# Patient Record
Sex: Male | Born: 1965 | Race: Black or African American | Hispanic: No | Marital: Married | Smoking: Never smoker
Health system: Southern US, Community
[De-identification: ages and names within clinical notes are randomized; demographics above are authoritative.]

## PROBLEM LIST (undated history)

## (undated) DIAGNOSIS — M1990 Unspecified osteoarthritis, unspecified site: Secondary | ICD-10-CM

## (undated) DIAGNOSIS — J45909 Unspecified asthma, uncomplicated: Secondary | ICD-10-CM

---

## 1998-11-28 ENCOUNTER — Emergency Department (HOSPITAL_COMMUNITY): Admission: EM | Admit: 1998-11-28 | Discharge: 1998-11-28 | Payer: Self-pay | Admitting: Emergency Medicine

## 1998-11-29 ENCOUNTER — Encounter: Payer: Self-pay | Admitting: Emergency Medicine

## 1998-12-05 ENCOUNTER — Emergency Department (HOSPITAL_COMMUNITY): Admission: EM | Admit: 1998-12-05 | Discharge: 1998-12-05 | Payer: Self-pay | Admitting: Emergency Medicine

## 2011-11-02 ENCOUNTER — Emergency Department (HOSPITAL_COMMUNITY)
Admission: EM | Admit: 2011-11-02 | Discharge: 2011-11-02 | Disposition: A | Discharge status: Home / Self Care | Payer: Worker's Compensation | Attending: Emergency Medicine | Admitting: Emergency Medicine

## 2011-11-02 DIAGNOSIS — S61209A Unspecified open wound of unspecified finger without damage to nail, initial encounter: Secondary | ICD-10-CM | POA: Insufficient documentation

## 2011-11-02 DIAGNOSIS — IMO0002 Reserved for concepts with insufficient information to code with codable children: Secondary | ICD-10-CM | POA: Insufficient documentation

## 2011-11-02 HISTORY — DX: Unspecified osteoarthritis, unspecified site: M19.90

## 2011-11-02 NOTE — ED Notes (Signed)
Pt presents with puncture wound to L 2nd finger after using a hole puncher, cutting finger.  Injury occurred at 0930.  Last tetanus x 2 years ago.

## 2011-11-02 NOTE — ED Notes (Signed)
Employee called and pt. Has a laceration to a finger with uncontrolled bleeding; controlled with pressure dressing.

## 2011-11-02 NOTE — ED Provider Notes (Addendum)
History     CSN: [id number]  Arrival date & time 11/02/11  1131   First MD Initiated Contact with Patient 11/02/11 1401      Chief Complaint  Patient presents with  . Puncture Wound    (Consider location/radiation/quality/duration/timing/severity/associated sxs/prior treatment) The history is provided by the patient.   the patient is a 46 year old, right-hand dominant male.  He says that he "hole punched" his leftt index finger.  He has no other complaints.  His last tetanus shot was 3 years ago.  He has no allergies to medications.  Past Medical History  Diagnosis Date  . Arthritis     History reviewed. No pertinent past surgical history.  No family history on file.  History  Substance Use Topics  . Smoking status: Never Smoker   . Smokeless tobacco: Not on file  . Alcohol Use: No      Review of Systems  Neurological: Negative for weakness.  Hematological: Does not bruise/bleed easily.  All other systems reviewed and are negative.    Allergies  Review of patient's allergies indicates no known allergies.  Home Medications   Current Outpatient Rx  Name Route Sig Dispense Refill  . ALBUTEROL SULFATE HFA 108 (90 BASE) MCG/ACT IN AERS Inhalation Inhale 2 puffs into the lungs every 6 (six) hours as needed. For shortness of breath       BP 150/95  Pulse 89  Temp(Src) 97.9 F (36.6 C) (Oral)  Resp 16  SpO2 95%  Physical Exam  Constitutional: He is oriented to person, place, and time. He appears well-developed and well-nourished.  HENT:  Head: Normocephalic.  Eyes: Conjunctivae are normal. Pupils are equal, round, and reactive to light.  Neck: Normal range of motion.  Pulmonary/Chest: Effort normal.  Abdominal: He exhibits no distension.  Musculoskeletal:       3 millimeter, circular, loss of tissue over the distal phalanx of his left index finger with constant mild bleeding.  There is no evidence of a tendon injury or bony injury.  Neurological: He is  alert and oriented to person, place, and time.  Skin: Skin is warm and dry.  Psychiatric: He has a normal mood and affect.    ED Course  Procedures (including critical care time) Approximately 3 mm, loss of skin of the distal phalanx of the left index finger.  The parents of the wound is consistent with a punch biopsy.  We will get hemostasis and applied, dressing.  There is no tissue to suture together.  There is no evidence of an nerve or tendon injury.  There is no evidence of fracture.  His tetanus is up-to-date.   Labs Reviewed - No data to display No results found.   No diagnosis found.  2:53 PM Bleeding controlled with QUICK CLOT  MDM  Skin laceration        Nicholes Stairs, MD 11/02/11 1453  Nicholes Stairs, MD 11/02/11 1455

## 2013-02-17 ENCOUNTER — Ambulatory Visit
Admission: RE | Admit: 2013-02-17 | Discharge: 2013-02-17 | Disposition: A | Discharge status: Home / Self Care | Payer: 59 | Source: Ambulatory Visit | Attending: Allergy and Immunology | Admitting: Allergy and Immunology

## 2013-02-17 ENCOUNTER — Other Ambulatory Visit: Payer: Self-pay | Admitting: Allergy and Immunology

## 2013-02-17 DIAGNOSIS — J45901 Unspecified asthma with (acute) exacerbation: Secondary | ICD-10-CM

## 2019-12-19 ENCOUNTER — Inpatient Hospital Stay (HOSPITAL_COMMUNITY)
Admission: EM | Admit: 2019-12-19 | Discharge: 2020-01-28 | DRG: 296 | Disposition: E | Payer: 59 | Attending: Pulmonary Disease | Admitting: Pulmonary Disease

## 2019-12-19 ENCOUNTER — Inpatient Hospital Stay (HOSPITAL_COMMUNITY): Payer: 59

## 2019-12-19 ENCOUNTER — Emergency Department (HOSPITAL_COMMUNITY): Payer: 59

## 2019-12-19 DIAGNOSIS — G9340 Encephalopathy, unspecified: Secondary | ICD-10-CM | POA: Diagnosis not present

## 2019-12-19 DIAGNOSIS — J189 Pneumonia, unspecified organism: Secondary | ICD-10-CM | POA: Diagnosis present

## 2019-12-19 DIAGNOSIS — M1990 Unspecified osteoarthritis, unspecified site: Secondary | ICD-10-CM | POA: Diagnosis present

## 2019-12-19 DIAGNOSIS — G931 Anoxic brain damage, not elsewhere classified: Secondary | ICD-10-CM

## 2019-12-19 DIAGNOSIS — E876 Hypokalemia: Secondary | ICD-10-CM | POA: Diagnosis not present

## 2019-12-19 DIAGNOSIS — J440 Chronic obstructive pulmonary disease with acute lower respiratory infection: Secondary | ICD-10-CM | POA: Diagnosis present

## 2019-12-19 DIAGNOSIS — R739 Hyperglycemia, unspecified: Secondary | ICD-10-CM | POA: Diagnosis present

## 2019-12-19 DIAGNOSIS — J9622 Acute and chronic respiratory failure with hypercapnia: Secondary | ICD-10-CM | POA: Diagnosis present

## 2019-12-19 DIAGNOSIS — N179 Acute kidney failure, unspecified: Secondary | ICD-10-CM | POA: Diagnosis present

## 2019-12-19 DIAGNOSIS — Z20822 Contact with and (suspected) exposure to covid-19: Secondary | ICD-10-CM | POA: Diagnosis present

## 2019-12-19 DIAGNOSIS — J9600 Acute respiratory failure, unspecified whether with hypoxia or hypercapnia: Secondary | ICD-10-CM

## 2019-12-19 DIAGNOSIS — Z9119 Patient's noncompliance with other medical treatment and regimen: Secondary | ICD-10-CM

## 2019-12-19 DIAGNOSIS — G253 Myoclonus: Secondary | ICD-10-CM

## 2019-12-19 DIAGNOSIS — I361 Nonrheumatic tricuspid (valve) insufficiency: Secondary | ICD-10-CM | POA: Diagnosis not present

## 2019-12-19 DIAGNOSIS — J9621 Acute and chronic respiratory failure with hypoxia: Secondary | ICD-10-CM | POA: Diagnosis present

## 2019-12-19 DIAGNOSIS — J9601 Acute respiratory failure with hypoxia: Secondary | ICD-10-CM

## 2019-12-19 DIAGNOSIS — I469 Cardiac arrest, cause unspecified: Principal | ICD-10-CM | POA: Diagnosis present

## 2019-12-19 DIAGNOSIS — G936 Cerebral edema: Secondary | ICD-10-CM | POA: Diagnosis not present

## 2019-12-19 DIAGNOSIS — I10 Essential (primary) hypertension: Secondary | ICD-10-CM | POA: Diagnosis present

## 2019-12-19 DIAGNOSIS — J9602 Acute respiratory failure with hypercapnia: Secondary | ICD-10-CM

## 2019-12-19 DIAGNOSIS — Z529 Donor of unspecified organ or tissue: Secondary | ICD-10-CM

## 2019-12-19 HISTORY — DX: Unspecified asthma, uncomplicated: J45.909

## 2019-12-19 LAB — CORTISOL: Cortisol, Plasma: 19 ug/dL

## 2019-12-19 LAB — CBC
HCT: 44.7 % (ref 39.0–52.0)
Hemoglobin: 14 g/dL (ref 13.0–17.0)
MCH: 30.2 pg (ref 26.0–34.0)
MCHC: 31.3 g/dL (ref 30.0–36.0)
MCV: 96.3 fL (ref 80.0–100.0)
Platelets: 371 10*3/uL (ref 150–400)
RBC: 4.64 MIL/uL (ref 4.22–5.81)
RDW: 13.1 % (ref 11.5–15.5)
WBC: 15.4 10*3/uL — ABNORMAL HIGH (ref 4.0–10.5)
nRBC: 0 % (ref 0.0–0.2)

## 2019-12-19 LAB — TROPONIN I (HIGH SENSITIVITY)
Troponin I (High Sensitivity): 10 ng/L (ref <18)
Troponin I (High Sensitivity): 48 ng/L — ABNORMAL HIGH (ref <18)

## 2019-12-19 LAB — POCT I-STAT 7, (LYTES, BLD GAS, ICA,H+H)
Acid-base deficit: 5 mmol/L — ABNORMAL HIGH (ref 0.0–2.0)
Bicarbonate: 23.1 mmol/L (ref 20.0–28.0)
Calcium, Ion: 1.1 mmol/L — ABNORMAL LOW (ref 1.15–1.40)
HCT: 39 % (ref 39.0–52.0)
Hemoglobin: 13.3 g/dL (ref 13.0–17.0)
O2 Saturation: 100 %
Patient temperature: 96.3
Potassium: 3.6 mmol/L (ref 3.5–5.1)
Sodium: 143 mmol/L (ref 135–145)
TCO2: 25 mmol/L (ref 22–32)
pCO2 arterial: 50.9 mmHg — ABNORMAL HIGH (ref 32.0–48.0)
pH, Arterial: 7.258 — ABNORMAL LOW (ref 7.350–7.450)
pO2, Arterial: 395 mmHg — ABNORMAL HIGH (ref 83.0–108.0)

## 2019-12-19 LAB — GLUCOSE, CAPILLARY: Glucose-Capillary: 130 mg/dL — ABNORMAL HIGH (ref 70–99)

## 2019-12-19 LAB — RESPIRATORY PANEL BY RT PCR (FLU A&B, COVID)
Influenza A by PCR: NEGATIVE
Influenza B by PCR: NEGATIVE
SARS Coronavirus 2 by RT PCR: NEGATIVE

## 2019-12-19 LAB — COMPREHENSIVE METABOLIC PANEL
ALT: 24 U/L (ref 0–44)
AST: 37 U/L (ref 15–41)
Albumin: 3.4 g/dL — ABNORMAL LOW (ref 3.5–5.0)
Alkaline Phosphatase: 65 U/L (ref 38–126)
Anion gap: 15 (ref 5–15)
BUN: 16 mg/dL (ref 6–20)
CO2: 18 mmol/L — ABNORMAL LOW (ref 22–32)
Calcium: 8.2 mg/dL — ABNORMAL LOW (ref 8.9–10.3)
Chloride: 108 mmol/L (ref 98–111)
Creatinine, Ser: 1.62 mg/dL — ABNORMAL HIGH (ref 0.61–1.24)
GFR calc Af Amer: 54 mL/min — ABNORMAL LOW (ref >60)
GFR calc non Af Amer: 47 mL/min — ABNORMAL LOW (ref >60)
Glucose, Bld: 288 mg/dL — ABNORMAL HIGH (ref 70–99)
Potassium: 4.2 mmol/L (ref 3.5–5.1)
Sodium: 141 mmol/L (ref 135–145)
Total Bilirubin: 0.8 mg/dL (ref 0.3–1.2)
Total Protein: 6 g/dL — ABNORMAL LOW (ref 6.5–8.1)

## 2019-12-19 LAB — LACTIC ACID, PLASMA
Lactic Acid, Venous: 6.6 mmol/L (ref 0.5–1.9)
Lactic Acid, Venous: 4.4 mmol/L (ref 0.5–1.9)

## 2019-12-19 LAB — HEMOGLOBIN A1C
Hgb A1c MFr Bld: 6.4 % — ABNORMAL HIGH (ref 4.8–5.6)
Mean Plasma Glucose: 136.98 mg/dL

## 2019-12-19 LAB — D-DIMER, QUANTITATIVE: D-Dimer, Quant: 9.58 ug/mL-FEU — ABNORMAL HIGH (ref 0.00–0.50)

## 2019-12-19 LAB — PROCALCITONIN: Procalcitonin: <0.1 ng/mL

## 2019-12-19 LAB — PHOSPHORUS: Phosphorus: 5.2 mg/dL — ABNORMAL HIGH (ref 2.5–4.6)

## 2019-12-19 LAB — PROTIME-INR
INR: 1 (ref 0.8–1.2)
Prothrombin Time: 12.9 seconds (ref 11.4–15.2)

## 2019-12-19 LAB — MAGNESIUM: Magnesium: 2.3 mg/dL (ref 1.7–2.4)

## 2019-12-19 MED ORDER — PROPOFOL 1000 MG/100ML IV EMUL
5.0000 ug/kg/min | INTRAVENOUS | Status: DC
  Administered 2019-12-19: 40 ug/kg/min via INTRAVENOUS
  Administered 2019-12-20: 80 ug/kg/min via INTRAVENOUS
  Administered 2019-12-20: 60 ug/kg/min via INTRAVENOUS
  Administered 2019-12-20: 40 ug/kg/min via INTRAVENOUS
  Administered 2019-12-20: 60 ug/kg/min via INTRAVENOUS
  Administered 2019-12-20 – 2019-12-21 (×2): 20 ug/kg/min via INTRAVENOUS
  Filled 2019-12-19 (×6): qty 100

## 2019-12-19 MED ORDER — PROPOFOL 1000 MG/100ML IV EMUL
5.0000 ug/kg/min | INTRAVENOUS | Status: DC
  Administered 2019-12-19: 5 ug/kg/min via INTRAVENOUS

## 2019-12-19 MED ORDER — ETOMIDATE 2 MG/ML IV SOLN
INTRAVENOUS | Status: AC | PRN
  Administered 2019-12-19: 20 mg via INTRAVENOUS

## 2019-12-19 MED ORDER — METHYLPREDNISOLONE SODIUM SUCC 125 MG IJ SOLR
125.0000 mg | Freq: Once | INTRAMUSCULAR | Status: AC
  Administered 2019-12-19: 125 mg via INTRAVENOUS
  Filled 2019-12-19: qty 2

## 2019-12-19 MED ORDER — ONDANSETRON HCL 4 MG/2ML IJ SOLN: 4.0000 mg | Freq: Four times a day (QID) | INTRAMUSCULAR | Status: DC | PRN

## 2019-12-19 MED ORDER — SODIUM CHLORIDE 0.9 % IV BOLUS
1000.0000 mL | Freq: Once | INTRAVENOUS | Status: AC
  Administered 2019-12-19: 1000 mL via INTRAVENOUS

## 2019-12-19 MED ORDER — ACETAMINOPHEN 325 MG PO TABS
650.0000 mg | ORAL_TABLET | ORAL | Status: DC | PRN
  Filled 2019-12-19: qty 2

## 2019-12-19 MED ORDER — ORAL CARE MOUTH RINSE
15.0000 mL | OROMUCOSAL | Status: DC
  Administered 2019-12-20 – 2019-12-28 (×86): 15 mL via OROMUCOSAL

## 2019-12-19 MED ORDER — SODIUM CHLORIDE 0.9 % IV SOLN
500.0000 mg | Freq: Once | INTRAVENOUS | Status: AC
  Administered 2019-12-19: 500 mg via INTRAVENOUS
  Filled 2019-12-19: qty 500

## 2019-12-19 MED ORDER — INSULIN ASPART 100 UNIT/ML ~~LOC~~ SOLN
0.0000 [IU] | Freq: Three times a day (TID) | SUBCUTANEOUS | Status: DC
  Administered 2019-12-20: 2 [IU] via SUBCUTANEOUS
  Administered 2019-12-20: 12:00:00 1 [IU] via SUBCUTANEOUS

## 2019-12-19 MED ORDER — IPRATROPIUM-ALBUTEROL 0.5-2.5 (3) MG/3ML IN SOLN
3.0000 mL | Freq: Once | RESPIRATORY_TRACT | Status: AC
  Administered 2019-12-19: 3 mL via RESPIRATORY_TRACT
  Filled 2019-12-19: qty 3

## 2019-12-19 MED ORDER — CHLORHEXIDINE GLUCONATE CLOTH 2 % EX PADS: 6.0000 | MEDICATED_PAD | Freq: Every day | CUTANEOUS | Status: DC

## 2019-12-19 MED ORDER — SODIUM CHLORIDE 0.9 % IV SOLN
500.0000 mg | INTRAVENOUS | Status: AC
  Administered 2019-12-20 – 2019-12-22 (×3): 500 mg via INTRAVENOUS
  Filled 2019-12-19 (×3): qty 500

## 2019-12-19 MED ORDER — LACTATED RINGERS IV SOLN: INTRAVENOUS | Status: DC

## 2019-12-19 MED ORDER — CEFTRIAXONE SODIUM 1 G IJ SOLR: 1.0000 g | INTRAMUSCULAR | Status: DC

## 2019-12-19 MED ORDER — ROCURONIUM BROMIDE 50 MG/5ML IV SOLN
INTRAVENOUS | Status: AC | PRN
  Administered 2019-12-19: 80 mg via INTRAVENOUS

## 2019-12-19 MED ORDER — SODIUM CHLORIDE 0.9 % IV SOLN: INTRAVENOUS | Status: DC

## 2019-12-19 MED ORDER — PROPOFOL 1000 MG/100ML IV EMUL
INTRAVENOUS | Status: AC
  Filled 2019-12-19: qty 100

## 2019-12-19 MED ORDER — SODIUM CHLORIDE 0.9 % IV SOLN
2.0000 g | Freq: Once | INTRAVENOUS | Status: AC
  Administered 2019-12-19: 2 g via INTRAVENOUS
  Filled 2019-12-19: qty 20

## 2019-12-19 MED ORDER — CHLORHEXIDINE GLUCONATE 0.12% ORAL RINSE (MEDLINE KIT)
15.0000 mL | Freq: Two times a day (BID) | OROMUCOSAL | Status: DC
  Administered 2019-12-19 – 2019-12-29 (×20): 15 mL via OROMUCOSAL

## 2019-12-19 MED ORDER — PANTOPRAZOLE SODIUM 40 MG IV SOLR
40.0000 mg | Freq: Every day | INTRAVENOUS | Status: DC
  Administered 2019-12-19 – 2019-12-29 (×11): 40 mg via INTRAVENOUS
  Filled 2019-12-19 (×12): qty 40

## 2019-12-19 MED ORDER — IPRATROPIUM-ALBUTEROL 0.5-2.5 (3) MG/3ML IN SOLN
3.0000 mL | Freq: Once | RESPIRATORY_TRACT | Status: AC
  Administered 2019-12-19: 3 mL via RESPIRATORY_TRACT
  Filled 2019-12-19 (×2): qty 3

## 2019-12-19 MED ORDER — HEPARIN SODIUM (PORCINE) 5000 UNIT/ML IJ SOLN
5000.0000 [IU] | Freq: Three times a day (TID) | INTRAMUSCULAR | Status: DC
  Administered 2019-12-19 – 2019-12-28 (×27): 5000 [IU] via SUBCUTANEOUS
  Filled 2019-12-19 (×28): qty 1

## 2019-12-19 NOTE — ED Notes (Signed)
Bare hugger placed on pt , temp of 92. Via temp foley

## 2019-12-19 NOTE — ED Provider Notes (Addendum)
[facility] Emergency Department Provider Note MRN:  [id number]  Arrival date & time: 12/11/2019     Chief Complaint   post CPR   History of Present Illness   Nathaniel Casey is a 54 y.o. year-old male with unknown past medical history presenting to the ED with chief complaint of CPR.  Patient reportedly called EMS due to shortness of breath, did not answer the door for the ambulance, entry to the home was gained, patient was found on the floor pulseless, not breathing.  CPR for 12 minutes, PEA arrest, given 2 doses of epinephrine, ROSC obtained.  I was unable to obtain an accurate HPI, PMH, or ROS due to the patient's cardiac arrest, altered mental status.  Level 5 caveat.  Review of Systems  Positive for cardiac arrest, shortness of breath.  Patient's Health History    Past Medical History:  Diagnosis Date  . Arthritis     No past surgical history on file.  No family history on file.  Social History   Socioeconomic History  . Marital status: Married    Spouse name: Not on file  . Number of children: Not on file  . Years of education: Not on file  . Highest education level: Not on file  Occupational History  . Not on file  Tobacco Use  . Smoking status: Never Smoker  Substance and Sexual Activity  . Alcohol use: No  . Drug use: No  . Sexual activity: Not on file  Other Topics Concern  . Not on file  Social History Narrative  . Not on file   Social Determinants of Health   Financial Resource Strain:   . Difficulty of Paying Living Expenses: Not on file  Food Insecurity:   . Worried About Programme researcher, broadcasting/film/video in the Last Year: Not on file  . Ran Out of Food in the Last Year: Not on file  Transportation Needs:   . Lack of Transportation (Medical): Not on file  . Lack of Transportation (Non-Medical): Not on file  Physical Activity:   . Days of Exercise per Week: Not on file  . Minutes of Exercise per Session: Not on file  Stress:   .  Feeling of Stress : Not on file  Social Connections:   . Frequency of Communication with Friends and Family: Not on file  . Frequency of Social Gatherings with Friends and Family: Not on file  . Attends Religious Services: Not on file  . Active Member of Clubs or Organizations: Not on file  . Attends Banker Meetings: Not on file  . Marital Status: Not on file  Intimate Partner Violence:   . Fear of Current or Ex-Partner: Not on file  . Emotionally Abused: Not on file  . Physically Abused: Not on file  . Sexually Abused: Not on file     Physical Exam   Vitals:   12/08/2019 1730 12/23/2019 1745  BP: 113/85 107/82  Pulse: (!) 102 (!) 101  Resp: (!) 22 (!) 22  Temp:    SpO2: 100% 100%    CONSTITUTIONAL: Ill-appearing NEURO: Unresponsive, intermittently gagging on King airway EYES: Pupils unresponsive ENT/NECK:  no LAD, no JVD CARDIO: Tachycardic rate, well-perfused, normal S1 and S2 PULM: Diffuse wheezing with prolonged expiratory phase GI/GU:  normal bowel sounds, moderately distended MSK/SPINE:  No gross deformities, no edema SKIN:  no rash, atraumatic PSYCH: Unable to assess  *Additional and/or pertinent findings included in MDM below  Diagnostic and Interventional  Summary    EKG Interpretation  Date/Time:  Saturday 01/03/20 16:56:36 EST Ventricular Rate:  120 PR Interval:    QRS Duration: 109 QT Interval:  320 QTC Calculation: 453 R Axis:   -80 Text Interpretation: Sinus tachycardia LAE, consider biatrial enlargement Left anterior fascicular block Abnormal R-wave progression, early transition ST elevation, consider inferior injury Baseline wander in lead(s) V5 Confirmed by Kennis Carina [id number]) on 2020-01-03 5:08:31 PM      Cardiac Monitoring Interpretation:  Labs Reviewed  CBC - Abnormal; Notable for the following components:      Result Value   WBC 15.4 (*)    All other components within normal limits  COMPREHENSIVE METABOLIC PANEL -  Abnormal; Notable for the following components:   CO2 18 (*)    Glucose, Bld 288 (*)    Creatinine, Ser 1.62 (*)    Calcium 8.2 (*)    Total Protein 6.0 (*)    Albumin 3.4 (*)    GFR calc non Af Amer 47 (*)    GFR calc Af Amer 54 (*)    All other components within normal limits  LACTIC ACID, PLASMA - Abnormal; Notable for the following components:   Lactic Acid, Venous 6.6 (*)    All other components within normal limits  RESPIRATORY PANEL BY RT PCR (FLU A&B, COVID)  BLOOD GAS, ARTERIAL  LACTIC ACID, PLASMA  TROPONIN I (HIGH SENSITIVITY)    DG Chest Port 1 View  Final Result      Medications  ipratropium-albuterol (DUONEB) 0.5-2.5 (3) MG/3ML nebulizer solution 3 mL (has no administration in time range)  ipratropium-albuterol (DUONEB) 0.5-2.5 (3) MG/3ML nebulizer solution 3 mL (has no administration in time range)  propofol (DIPRIVAN) 1000 MG/100ML infusion (5 mcg/kg/min  80 kg Intravenous New Bag/Given 01-03-2020 1714)  propofol (DIPRIVAN) 1000 MG/100ML infusion (has no administration in time range)  cefTRIAXone (ROCEPHIN) 2 g in sodium chloride 0.9 % 100 mL IVPB (has no administration in time range)  azithromycin (ZITHROMAX) 500 mg in sodium chloride 0.9 % 250 mL IVPB (has no administration in time range)  sodium chloride 0.9 % bolus 1,000 mL (has no administration in time range)  etomidate (AMIDATE) injection (20 mg Intravenous Given Jan 03, 2020 1645)  rocuronium (ZEMURON) injection (80 mg Intravenous Given 01-03-2020 1646)  ipratropium-albuterol (DUONEB) 0.5-2.5 (3) MG/3ML nebulizer solution 3 mL (3 mLs Nebulization Given January 03, 2020 1715)  methylPREDNISolone sodium succinate (SOLU-MEDROL) 125 mg/2 mL injection 125 mg (125 mg Intravenous Given 01/03/20 1732)  sodium chloride 0.9 % bolus 1,000 mL (1,000 mLs Intravenous New Bag/Given 01/03/20 1702)     Procedures  /  Critical Care Procedure Name: Intubation Date/Time: 01-03-2020 5:08 PM Performed by: Sabas Sous, MD Pre-anesthesia  Checklist: Patient identified, Patient being monitored, Emergency Drugs available, Timeout performed and Suction available Oxygen Delivery Method: Ambu bag Preoxygenation: Pre-oxygenation with 100% oxygen Induction Type: Rapid sequence Ventilation: Mask ventilation without difficulty Laryngoscope Size: Glidescope and 4 Grade View: Grade I Tube size: 7.5 mm Number of attempts: 2 Airway Equipment and Method: Rigid stylet Placement Confirmation: ETT inserted through vocal cords under direct vision,  CO2 detector and Breath sounds checked- equal and bilateral Secured at: 23 cm Tube secured with: ETT holder Comments: First attempt with direct laryngoscopy using #2 Hyacinth Meeker, some difficulty due to large tongue, small mouth, blade too short.  Quickly transitioned to glide scope, which provided grade 1 view.  10 mg etomidate, 80 mg rocuronium used.    .Critical Care Performed by: Sabas Sous, MD  Authorized by: Maudie Flakes, MD   Critical care provider statement:    Critical care time (minutes):  38   Critical care was necessary to treat or prevent imminent or life-threatening deterioration of the following conditions:  Circulatory failure, cardiac failure and respiratory failure (Cardiac arrest, respiratory failure)   Critical care was time spent personally by me on the following activities:  Discussions with consultants, evaluation of patient's response to treatment, examination of patient, ordering and performing treatments and interventions, ordering and review of laboratory studies, ordering and review of radiographic studies, pulse oximetry, re-evaluation of patient's condition, obtaining history from patient or surrogate and review of old charts    ED Course and Medical Decision Making  I have reviewed the triage vital signs, the nursing notes, and pertinent available records from the EMR.  Pertinent labs & imaging results that were available during my care of the patient were  reviewed by me and considered in my medical decision making (see below for details).     Suspicious for COPD exacerbation causing hypoxia which led to cardiac arrest.  Patient found next to his inhaler at home, he exhibits persistently elevated end-tidal CO2's, suspect CO2 retention in the setting of COPD.  He has diffuse wheezing on exam with prolonged expiratory phase.  Received 12 minutes of CPR, unknown amount of downtime, received 2 mg epinephrine prior to arrival here.  He is hemodynamically stable, in fact he is tachycardic and hypertensive likely related to the epinephrine.  Will provide Solu-Medrol, duo nebs.  Intubated as described above.  Concern for anoxic brain injury based on his lack of neurological function prior to RSI.  Will admit to intensivist service.  6:12 PM update: Accepted for admission by intensivist service.  Patient's mother has been called and notified of patient's critical condition.  Barth Kirks. Sedonia Small, [place] [email or web address]  Final Clinical Impressions(s) / ED Diagnoses     ICD-10-CM   1. Cardiac arrest ([place])  I46.9   2. Acute respiratory failure, unspecified whether with hypoxia or hypercapnia ([place])  J96.00     ED Discharge Orders    None       Discharge Instructions Discussed with and Provided to Patient:   Discharge Instructions   None       Maudie Flakes, MD Dec 21, 2019 Evalee Jefferson    Maudie Flakes, MD Dec 21, 2019 [phone]

## 2019-12-19 NOTE — ED Triage Notes (Signed)
Patient arrived by [facility]-patient had called 911 for SOB and EMS arrival 5 minutes after call found patient unresponsive/ pulseless in living room floor. Initial PEA rate of 40. Patient had inhaler in hand. After 12 minutes of CPR and EPI X 2 positive pulses with ST. Arrived with PIV x 1, IO x 1 and King airway CBG 140 Also received NS 500

## 2019-12-19 NOTE — Progress Notes (Signed)
eLink Physician-Brief Progress Note Patient Name: Nathaniel Casey DOB: 23-Jan-1966 MRN: [id number]   Date of Service  Jan 10, 2020  HPI/Events of Note  New admit arrived in ICU after completing NCHCT in ED which showed no acute intracranial abnormality and preservation of gray-white matter differentiation.  Camera evaluation performed. 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  The patient is currently on propofol for sedation. His vitals are within normal limits. He is not requiring vasopressors. His SpO2 is 99% on FiO2 0.8.   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 No therapy is of demonstrated benefit for PHM. - Neuro consult in AM for EEG and assistance with prognostication.  Cardiac: - TTE ordered for AM. - No current need for vasopressors.  Resp: - Continue MV. Wean FiO2 as tolerated, targeting higher SpO2 goal of ~ 98% given concern for ischemic neurological insult. - Bilateral apical infiltrates on CXR (R > L) c/w aspiration pneumonitis vs pneumonia. On ceftriaxone/azithro.  ID: - ABX as above for pneumonia. - F/u tracheal aspirate culture.  Renal: - LR mIVF at 75cc/hr. - Making good UOP at present. - Repeat ABG and lactic acid in AM.  Endo: - ISS with Q4H fingerstick glucose checks.  GI: - NPO for now.  DVT PPX: Heparin [place]. GI PPX: Protonix IV daily CODE STATUS: Full Code     Intervention Category Evaluation Type: New Patient Evaluation  [name] 01-10-2020, 8:53 PM

## 2019-12-19 NOTE — H&P (Signed)
NAME:  Nathaniel Casey, MRN:  [id number], DOB:  [birth year], LOS: 0 ADMISSION DATE:  12/20/2019, CONSULTATION DATE:  REFERRING MD:  ED, CHIEF COMPLAINT:  Cardiac arrest   Brief History   Out of hospital cardiac arrest  History of present illness   We have essentially no history on this 54 year old who called EMS because he was short of breath.  He was found down with a inhaler in his hand.  He was in PEA arrest.  He underwent 8 minutes of CPR before ROSC.  Past Medical History  Unknown  Significant Hospital Events   Intubated in the department of emergency medicine on 2/20 using etomidate and rocuronium  Consults:  None  Procedures:  None  Significant Diagnostic Tests:  Head CT, D-dimer and serial troponins are pending  Micro Data:  Blood and sputum cultures were obtained in the emergency room on 2/20  Antimicrobials:  Azithromycin and Rocephin  Interim history/subjective:  Admitted from the ER on 2/20.  He is currently orally intubated and completely unresponsive  Objective   Blood pressure 107/82, pulse (!) 101, temperature (!) 96.3 F (35.7 C), temperature source Temporal, resp. rate (!) 22, height 5' 7"  (1.702 m), weight 85 kg, SpO2 100 %.    Vent Mode: PRVC FiO2 (%):  [100 %] 100 % Set Rate:  [22 bmp] 22 bmp Vt Set:  [520 mL] 520 mL PEEP:  [5 cmH20] 5 cmH20 Plateau Pressure:  [28 cmH20] 28 cmH20   Intake/Output Summary (Last 24 hours) at 12/17/2019 1836 Last data filed at 12/16/2019 1641 Gross per 24 hour  Intake 500 ml  Output --  Net 500 ml   Filed Weights   12/06/2019 1653 11/30/2019 1657  Weight: 80 kg 85 kg    Examination: General: Middle-aged male who is orally intubated and unresponsive. HENT: No external evidence of trauma. Lungs: Symmetric air movement with scattered rhonchi right greater than left and scattered wheezes.  Does not have a prolonged expiratory phase. Cardiovascular: There is significant JVD, S1 and S2 are somewhat distant and  regular without murmur rub or gallop.  There is no dependent edema the limbs are warm Abdomen: The abdomen is distended and tympanic active bowel sounds are present there are no masses Extremities: The limbs are warm without edema  neuro: The limbs remain flaccid and I suspect we are still seeing the effects of rocuronium.  He does have some slight anisocoria with the right pupil being 3 mm and the left 2 GU:   Resolved Hospital Problem list     Assessment & Plan:  This is a 54 year old with unknown past medical history who called the EMS for dyspnea and was found down and in PEA.  Total downtime is not known. We will be looking for the usual provocations for PEA arrest, in this case I am most suspicious of hypercarbia.  A D-dimer is an initial screening for PE is pending and serial enzymes are pending as well.  The initial EKG does not suggest acute ischemia. 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  A head CT is pending secondary to his anisocoria. He also has an abnormal chest x-ray with a suggestion of bilateral upper lobe infiltrates and a corresponding abnormal exam.  He has been started on inhaled bronchodilators and empirically started on a combination of azithromycin and Rocephin for community-acquired pneumonia.  He is Covid and influenza negative.  Marginally elevated creatinine with unknown baseline.  Best practice:  Diet: N.p.o. Pain/Anxiety/Delirium protocol (if indicated): Pending neurological status once rocuronium has been metabolized VAP protocol (if indicated): Ordered DVT prophylaxis: He requires SCDs and if the head CT is negative prophylactic dose heparin GI prophylaxis: Ordered Glucose control: He is hyperglycemic but I do not know whether this is representative of a chronic condition or whether he received epinephrine for  resuscitation.  Sliding scale insulin and A1c have been ordered Mobility: Bedrest Code Status: Full Family Communication: I have not yet met family Disposition:   Labs   CBC: Recent Labs  Lab 12/23/2019 1712 12/26/2019 1815  WBC 15.4*  --   HGB 14.0 13.3  HCT 44.7 39.0  MCV 96.3  --   PLT 371  --     Basic Metabolic Panel: Recent Labs  Lab 12/10/2019 1712 12/06/2019 1815  NA 141 143  K 4.2 3.6  CL 108  --   CO2 18*  --   GLUCOSE 288*  --   BUN 16  --   CREATININE 1.62*  --   CALCIUM 8.2*  --    GFR: Estimated Creatinine Clearance: 53.1 mL/min (A) (by C-G formula based on SCr of 1.62 mg/dL (H)). Recent Labs  Lab 12/09/2019 1712  WBC 15.4*  LATICACIDVEN 6.6*    Liver Function Tests: Recent Labs  Lab 12/12/2019 1712  AST 37  ALT 24  ALKPHOS 65  BILITOT 0.8  PROT 6.0*  ALBUMIN 3.4*   No results for input(s): LIPASE, AMYLASE in the last 168 hours. No results for input(s): AMMONIA in the last 168 hours.  ABG    Component Value Date/Time   PHART 7.258 (L) 12/18/2019 1815   PCO2ART 50.9 (H) 12/12/2019 1815   PO2ART 395.0 (H) 12/12/2019 1815   HCO3 23.1 12/25/2019 1815   TCO2 25 12/12/2019 1815   ACIDBASEDEF 5.0 (H) 12/14/2019 1815   O2SAT 100.0 12/06/2019 1815     Coagulation Profile: No results for input(s): INR, PROTIME in the last 168 hours.  Cardiac Enzymes: No results for input(s): CKTOTAL, CKMB, CKMBINDEX, TROPONINI in the last 168 hours.  HbA1C: No results found for: HGBA1C  CBG: No results for input(s): GLUCAP in the last 168 hours.  Review of Systems:   Unobtainable  Past Medical History  He,  has a past medical history of Arthritis.   Surgical History   No past surgical history on file.   Social History   reports that he has never smoked. He does not have any smokeless tobacco history on file. He reports that he does not drink alcohol or use drugs.   Family History   His family history is not on file.   Allergies No Known Allergies    Home Medications  Prior to Admission medications   Medication Sig Start Date End Date Taking? Authorizing Provider  albuterol (PROVENTIL HFA;VENTOLIN HFA) 108 (90 BASE) MCG/ACT inhaler Inhale 2 puffs into the lungs every 6 (six) hours as needed. For shortness of breath     [provider]     Critical care time: Greater than 32 minutes was spent in the care of this acutely ill patient with a life-threatening presentation.  Lars Masson, MD Critical Care Medicine

## 2019-12-19 NOTE — Progress Notes (Signed)
ED RT given report and aware of pt.

## 2019-12-20 ENCOUNTER — Inpatient Hospital Stay (HOSPITAL_COMMUNITY): Payer: 59

## 2019-12-20 DIAGNOSIS — G931 Anoxic brain damage, not elsewhere classified: Secondary | ICD-10-CM

## 2019-12-20 DIAGNOSIS — I361 Nonrheumatic tricuspid (valve) insufficiency: Secondary | ICD-10-CM

## 2019-12-20 LAB — RESPIRATORY PANEL BY PCR

## 2019-12-20 LAB — MAGNESIUM
Magnesium: 1.5 mg/dL — ABNORMAL LOW (ref 1.7–2.4)
Magnesium: 2.9 mg/dL — ABNORMAL HIGH (ref 1.7–2.4)

## 2019-12-20 LAB — CBC
HCT: 43.3 % (ref 39.0–52.0)
Hemoglobin: 14.4 g/dL (ref 13.0–17.0)
MCH: 29.3 pg (ref 26.0–34.0)
MCHC: 33.3 g/dL (ref 30.0–36.0)
MCV: 88 fL (ref 80.0–100.0)
Platelets: 326 10*3/uL (ref 150–400)
RBC: 4.92 MIL/uL (ref 4.22–5.81)
RDW: 13.1 % (ref 11.5–15.5)
WBC: 23.1 10*3/uL — ABNORMAL HIGH (ref 4.0–10.5)
nRBC: 0 % (ref 0.0–0.2)

## 2019-12-20 LAB — BASIC METABOLIC PANEL
Anion gap: 13 (ref 5–15)
BUN: 12 mg/dL (ref 6–20)
CO2: 21 mmol/L — ABNORMAL LOW (ref 22–32)
Calcium: 7.5 mg/dL — ABNORMAL LOW (ref 8.9–10.3)
Chloride: 106 mmol/L (ref 98–111)
Creatinine, Ser: 1.16 mg/dL (ref 0.61–1.24)
GFR calc Af Amer: >60 mL/min (ref >60)
GFR calc non Af Amer: >60 mL/min (ref >60)
Glucose, Bld: 184 mg/dL — ABNORMAL HIGH (ref 70–99)
Potassium: 3.5 mmol/L (ref 3.5–5.1)
Sodium: 140 mmol/L (ref 135–145)

## 2019-12-20 LAB — POCT I-STAT 7, (LYTES, BLD GAS, ICA,H+H)
Bicarbonate: 22.7 mmol/L (ref 20.0–28.0)
Bicarbonate: 23.2 mmol/L (ref 20.0–28.0)
Calcium, Ion: 1.03 mmol/L — ABNORMAL LOW (ref 1.15–1.40)
Calcium, Ion: 1.01 mmol/L — ABNORMAL LOW (ref 1.15–1.40)
HCT: 38 % — ABNORMAL LOW (ref 39.0–52.0)
HCT: 41 % (ref 39.0–52.0)
Hemoglobin: 12.9 g/dL — ABNORMAL LOW (ref 13.0–17.0)
Hemoglobin: 13.9 g/dL (ref 13.0–17.0)
O2 Saturation: 78 %
O2 Saturation: 100 %
Patient temperature: 99.9
Potassium: 3.3 mmol/L — ABNORMAL LOW (ref 3.5–5.1)
Potassium: 3.6 mmol/L (ref 3.5–5.1)
Sodium: 130 mmol/L — ABNORMAL LOW (ref 135–145)
Sodium: 138 mmol/L (ref 135–145)
TCO2: 24 mmol/L (ref 22–32)
TCO2: 24 mmol/L (ref 22–32)
pCO2 arterial: 31 mmHg — ABNORMAL LOW (ref 32.0–48.0)
pCO2 arterial: 33.7 mmHg (ref 32.0–48.0)
pH, Arterial: 7.473 — ABNORMAL HIGH (ref 7.350–7.450)
pH, Arterial: 7.448 (ref 7.350–7.450)
pO2, Arterial: 39 mmHg — CL (ref 83.0–108.0)
pO2, Arterial: 309 mmHg — ABNORMAL HIGH (ref 83.0–108.0)

## 2019-12-20 LAB — ECHOCARDIOGRAM COMPLETE
Height: 67 in
Weight: 2426.82 oz

## 2019-12-20 LAB — PHOSPHORUS
Phosphorus: 1.2 mg/dL — ABNORMAL LOW (ref 2.5–4.6)
Phosphorus: 3.6 mg/dL (ref 2.5–4.6)
Phosphorus: 2.6 mg/dL (ref 2.5–4.6)

## 2019-12-20 LAB — GLUCOSE, CAPILLARY
Glucose-Capillary: 136 mg/dL — ABNORMAL HIGH (ref 70–99)
Glucose-Capillary: 136 mg/dL — ABNORMAL HIGH (ref 70–99)
Glucose-Capillary: 139 mg/dL — ABNORMAL HIGH (ref 70–99)
Glucose-Capillary: 168 mg/dL — ABNORMAL HIGH (ref 70–99)
Glucose-Capillary: 175 mg/dL — ABNORMAL HIGH (ref 70–99)
Glucose-Capillary: 186 mg/dL — ABNORMAL HIGH (ref 70–99)

## 2019-12-20 LAB — BRAIN NATRIURETIC PEPTIDE: B Natriuretic Peptide: 68.1 pg/mL (ref 0.0–100.0)

## 2019-12-20 LAB — MRSA PCR SCREENING: MRSA by PCR: NEGATIVE

## 2019-12-20 LAB — LACTIC ACID, PLASMA: Lactic Acid, Venous: 3.9 mmol/L (ref 0.5–1.9)

## 2019-12-20 MED ORDER — VITAL HIGH PROTEIN PO LIQD
1000.0000 mL | ORAL | Status: DC
  Administered 2019-12-20: 1000 mL

## 2019-12-20 MED ORDER — BISACODYL 10 MG RE SUPP: 10.0000 mg | Freq: Every day | RECTAL | Status: DC | PRN

## 2019-12-20 MED ORDER — LACTATED RINGERS IV SOLN: INTRAVENOUS | Status: DC

## 2019-12-20 MED ORDER — SODIUM CHLORIDE 0.9 % IV SOLN: 2.0000 g | INTRAVENOUS | Status: DC

## 2019-12-20 MED ORDER — METHYLPREDNISOLONE SODIUM SUCC 125 MG IJ SOLR
60.0000 mg | Freq: Two times a day (BID) | INTRAMUSCULAR | Status: AC
  Administered 2019-12-20 – 2019-12-21 (×4): 60 mg via INTRAVENOUS
  Filled 2019-12-20 (×4): qty 2

## 2019-12-20 MED ORDER — CALCIUM GLUCONATE-NACL 1-0.675 GM/50ML-% IV SOLN
1.0000 g | Freq: Once | INTRAVENOUS | Status: AC
  Administered 2019-12-20: 1000 mg via INTRAVENOUS
  Filled 2019-12-20: qty 50

## 2019-12-20 MED ORDER — FENTANYL CITRATE (PF) 100 MCG/2ML IJ SOLN
50.0000 ug | INTRAMUSCULAR | Status: DC | PRN
  Administered 2019-12-22: 50 ug via INTRAVENOUS
  Filled 2019-12-20: qty 2

## 2019-12-20 MED ORDER — POTASSIUM PHOSPHATES 15 MMOLE/5ML IV SOLN
30.0000 mmol | Freq: Once | INTRAVENOUS | Status: AC
  Administered 2019-12-20: 30 mmol via INTRAVENOUS
  Filled 2019-12-20: qty 10

## 2019-12-20 MED ORDER — ACETAMINOPHEN 160 MG/5ML PO SOLN
650.0000 mg | ORAL | Status: DC | PRN
  Administered 2019-12-23 – 2019-12-27 (×4): 650 mg
  Filled 2019-12-20 (×4): qty 20.3

## 2019-12-20 MED ORDER — CHLORHEXIDINE GLUCONATE CLOTH 2 % EX PADS
6.0000 | MEDICATED_PAD | Freq: Every day | CUTANEOUS | Status: DC
  Administered 2019-12-21 – 2019-12-29 (×9): 6 via TOPICAL

## 2019-12-20 MED ORDER — METHYLPREDNISOLONE SODIUM SUCC 125 MG IJ SOLR
60.0000 mg | Freq: Every day | INTRAMUSCULAR | Status: AC
  Administered 2019-12-22 – 2019-12-24 (×3): 60 mg via INTRAVENOUS
  Filled 2019-12-20 (×3): qty 2

## 2019-12-20 MED ORDER — SODIUM CHLORIDE 0.9 % IV SOLN
6.0000 g | Freq: Once | INTRAVENOUS | Status: AC
  Administered 2019-12-20: 6 g via INTRAVENOUS
  Filled 2019-12-20: qty 2

## 2019-12-20 MED ORDER — ACETAMINOPHEN 160 MG/5ML PO SOLN
650.0000 mg | Freq: Once | ORAL | Status: AC
  Administered 2019-12-20: 650 mg
  Filled 2019-12-20: qty 20.3

## 2019-12-20 MED ORDER — INSULIN ASPART 100 UNIT/ML ~~LOC~~ SOLN
0.0000 [IU] | SUBCUTANEOUS | Status: DC
  Administered 2019-12-20 (×2): 1 [IU] via SUBCUTANEOUS
  Administered 2019-12-21: 2 [IU] via SUBCUTANEOUS
  Administered 2019-12-21: 1 [IU] via SUBCUTANEOUS
  Administered 2019-12-21: 20:00:00 2 [IU] via SUBCUTANEOUS
  Administered 2019-12-21 – 2019-12-23 (×8): 1 [IU] via SUBCUTANEOUS
  Administered 2019-12-23: 2 [IU] via SUBCUTANEOUS
  Administered 2019-12-23 (×2): 1 [IU] via SUBCUTANEOUS
  Administered 2019-12-23 – 2019-12-24 (×2): 2 [IU] via SUBCUTANEOUS
  Administered 2019-12-24 – 2019-12-25 (×5): 1 [IU] via SUBCUTANEOUS
  Administered 2019-12-25: 2 [IU] via SUBCUTANEOUS
  Administered 2019-12-25 – 2019-12-26 (×7): 1 [IU] via SUBCUTANEOUS
  Administered 2019-12-27: 2 [IU] via SUBCUTANEOUS
  Administered 2019-12-27: 1 [IU] via SUBCUTANEOUS
  Administered 2019-12-27: 2 [IU] via SUBCUTANEOUS
  Administered 2019-12-27 – 2019-12-28 (×8): 1 [IU] via SUBCUTANEOUS

## 2019-12-20 MED ORDER — MIDAZOLAM HCL 2 MG/2ML IJ SOLN
2.0000 mg | INTRAMUSCULAR | Status: DC | PRN
  Administered 2019-12-23: 10:00:00 2 mg via INTRAVENOUS
  Filled 2019-12-20: qty 2

## 2019-12-20 MED ORDER — PRO-STAT SUGAR FREE PO LIQD
30.0000 mL | Freq: Two times a day (BID) | ORAL | Status: DC
  Administered 2019-12-20 – 2019-12-21 (×3): 30 mL
  Filled 2019-12-20 (×3): qty 30

## 2019-12-20 MED ORDER — FENTANYL CITRATE (PF) 100 MCG/2ML IJ SOLN
50.0000 ug | INTRAMUSCULAR | Status: DC | PRN
  Administered 2019-12-22: 50 ug via INTRAVENOUS
  Administered 2019-12-23 – 2019-12-24 (×4): 100 ug via INTRAVENOUS
  Administered 2019-12-24: 50 ug via INTRAVENOUS
  Filled 2019-12-20 (×8): qty 2

## 2019-12-20 MED ORDER — DOCUSATE SODIUM 50 MG/5ML PO LIQD
100.0000 mg | Freq: Two times a day (BID) | ORAL | Status: DC | PRN
  Administered 2019-12-27: 100 mg
  Filled 2019-12-20: qty 10

## 2019-12-20 MED ORDER — MIDAZOLAM HCL 2 MG/2ML IJ SOLN
2.0000 mg | INTRAMUSCULAR | Status: DC | PRN
  Administered 2019-12-23 – 2019-12-24 (×8): 2 mg via INTRAVENOUS
  Filled 2019-12-20 (×8): qty 2

## 2019-12-20 MED ORDER — POTASSIUM CHLORIDE 20 MEQ/15ML (10%) PO SOLN
40.0000 meq | Freq: Once | ORAL | Status: AC
  Administered 2019-12-20: 40 meq via ORAL
  Filled 2019-12-20: qty 30

## 2019-12-20 NOTE — Progress Notes (Signed)
eLink Physician-Brief Progress Note Patient Name: Nathaniel Casey DOB: 1966/09/20 MRN: [id number]   Date of Service  12/20/2019  HPI/Events of Note  Low K and phos.  eICU Interventions  Ordered 30 mmol of KPhos.     Intervention Category Intermediate Interventions: Electrolyte abnormality - evaluation and management  Marveen Reeks Jacqueline Spofford 12/20/2019, 5:42 AM

## 2019-12-20 NOTE — Progress Notes (Signed)
NAME:  Nathaniel Casey, MRN:  [id number], DOB:  11-14-1965, LOS: 1 ADMISSION DATE:  12/25/2019, CONSULTATION DATE:  REFERRING MD:  ED, CHIEF COMPLAINT:  Cardiac arrest   Brief History   Out of hospital cardiac arrest  History of present illness   We have essentially no history on this 54 year old who called EMS because he was short of breath.  He was found down with a inhaler in his hand.  He was in PEA arrest.  He underwent 8 minutes of CPR before ROSC.  Past Medical History  Unknown  Significant Hospital Events   2/20 Intubated in the department of emergency medicine on 2/20 using etomidate and rocuronium. CT H without acute abnormality. Myoclonic jerking noted at bedside, started on propofol  2/21 ongoing rhythmic jerking, increasing when propofol paused. Propofol resumed.   Consults:  Neurology   Procedures:  2/20 ETT> Significant Diagnostic Tests:  Head CT 2/20> no acute intracranial abnormality   Micro Data:  Blood and sputum cultures were obtained in the emergency room on 2/20  Antimicrobials:  Azithromycin and Rocephin  Interim history/subjective:  FiO2 has been weaned Overnight, increasing myoclonic seeming movement. Propofol increased.  Paused for exam 2/21 with notable increase in involuntary jerking movement, subsequently resumed.   Objective   Blood pressure 115/81, pulse 96, temperature 98.8 F (37.1 C), resp. rate (!) 22, height 5\' 7"  (1.702 m), weight 68.8 kg, SpO2 100 %.    Vent Mode: PRVC FiO2 (%):  [40 %-100 %] 40 % Set Rate:  [22 bmp] 22 bmp Vt Set:  [520 mL] 520 mL PEEP:  [5 cmH20] 5 cmH20 Plateau Pressure:  [15 cmH20-28 cmH20] 17 cmH20   Intake/Output Summary (Last 24 hours) at 12/20/2019 0905 Last data filed at 12/20/2019 0827 Gross per 24 hour  Intake 4539.95 ml  Output 2175 ml  Net 2364.95 ml   Filed Weights   11/30/2019 1657 12/26/2019 2000 12/20/19 0420  Weight: 85 kg 68.8 kg 68.8 kg    Examination: General: WDWN middle aged M,  intubated sedated, NAD but with intermittent involuntary BUE BLE movement  HENT: NCAT Pink mmm anicteric sclera. ETT secure trachea midline  Lungs: Symmetrical chest expansion. Bilateral rhonchi. Faint wheeze. No accessory muscle use.  Cardiovascular: Tachycardic rate. s1s2 are audible but distant. No appreciable rgm.   Abdomen: round, mildly distended. normaoctive x4 Extremities: Symmetrical muscle bulk, no obvious joint deformity, no cyanosis.  Neuro: Involuntary BUE BLE jerking movements. Slight anisocoria; R pupil 24mm L pupil 24mm. Does not awaken or follow commands. Increasing involuntary movement with stimulation.  GU: foley  Resolved Hospital Problem list     Assessment & Plan:  This is a 54 year old with unknown past medical history who called the EMS for dyspnea and was found down and in PEA.  Total downtime is not known. Unfortunately PMH is largely unknown at this time, with only known home med being albuterol.  S/p PEA arrest with ROSC -unknown total down time, received 12 minutes CPR, 2 amps Epi with ROSC -suspect provoked by hypercarbic/hypoxic respiratory failure as EMS was dispatched for SOB, patient found pulseless with inhaler, and presenting EtCO2 elevated.  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 Doubt these  are largely contributing to this involuntary movement but is possible to contribute P -will consult neurology for LTM vs EEG  -Suspect we will need MRI pending eeg  -continue propofol -Replacing calcium and mag   Acute on chronic respiratory failure with hypercarbia, hypoxia  requiring intubation -home med albuterol-- hx is not known but suspect COPD vs asthma  -Suspected CAP with BUL infiltrates on CXR, improving. 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 Will discuss with PCCM MD -Follow up ECHO   AKI -unknown baseline Cr P -Strict I/O -trend renal indices  Leukocytosis -possible CAP, possibly reactive in setting of cardiac arrest, steroids -PCT < 0.1 -increasing temp overnight although this seemed to correlate to increasing myoclonic-seeming movement  P -empiric abx for possible CAP as above -follow up culture data -trend cbc, fever curve -APAP for fever   Hypocalcemia Hypomagnesemia Hypophosphatemia P -Replacing, trend and continue to replace PRN   Hyperglycemia P -SSI   Best practice:  Diet: N.p.o. Pain/Anxiety/Delirium protocol (if indicated): Propofol VAP protocol (if indicated): Ordered DVT prophylaxis: SCD SQH GI prophylaxis: Protonix Glucose control: SSI Mobility: Bedrest Code Status: Full Family Communication: Pending 2/21 Disposition: ICU   Labs   CBC: Recent Labs  Lab Jan 10, 2020 1712 01/10/2020 1815 12/20/19 0410 12/20/19 0439 12/20/19 0625  WBC 15.4*  --   --  23.1*  --   HGB 14.0 13.3 12.9* 14.4 13.9  HCT 44.7 39.0 38.0* 43.3 41.0  MCV 96.3  --   --  88.0  --   PLT 371  --   --  326  --     Basic Metabolic Panel: Recent Labs  Lab 01/10/20 1712 01-10-2020 1815 10-Jan-2020 1907 12/20/19 0148 12/20/19 0410 12/20/19 0625   NA 141 143  --  140 130* 138  K 4.2 3.6  --  3.5 3.3* 3.6  CL 108  --   --  106  --   --   CO2 18*  --   --  21*  --   --   GLUCOSE 288*  --   --  184*  --   --   BUN 16  --   --  12  --   --   CREATININE 1.62*  --   --  1.16  --   --   CALCIUM 8.2*  --   --  7.5*  --   --   MG  --   --  2.3 1.5*  --   --   PHOS  --   --  5.2* 1.2*  --   --    GFR: Estimated Creatinine Clearance: 66.5 mL/min (by C-G formula based on SCr of 1.16 mg/dL). Recent Labs  Lab 2020/01/10 1712 January 10, 2020 1907 10-Jan-2020 1948 12/20/19 0148 12/20/19 0439  PROCALCITON  --  <0.10  --   --   --   WBC 15.4*  --   --   --  23.1*  LATICACIDVEN 6.6*  --  4.4* 3.9*  --  Liver Function Tests: Recent Labs  Lab 2019-12-21 1712  AST 37  ALT 24  ALKPHOS 65  BILITOT 0.8  PROT 6.0*  ALBUMIN 3.4*   No results for input(s): LIPASE, AMYLASE in the last 168 hours. No results for input(s): AMMONIA in the last 168 hours.  ABG    Component Value Date/Time   PHART 7.448 12/20/2019 0625   PCO2ART 33.7 12/20/2019 0625   PO2ART 309.0 (H) 12/20/2019 0625   HCO3 23.2 12/20/2019 0625   TCO2 24 12/20/2019 0625   ACIDBASEDEF 5.0 (H) 21-Dec-2019 1815   O2SAT 100.0 12/20/2019 0625     Coagulation Profile: Recent Labs  Lab 12-21-19 1948  INR 1.0    Cardiac Enzymes: No results for input(s): CKTOTAL, CKMB, CKMBINDEX, TROPONINI in the last 168 hours.  HbA1C: Hgb A1c MFr Bld  Date/Time Value Ref Range Status  December 21, 2019 07:48 PM 6.4 (H) 4.8 - 5.6 % Final    Comment:    (NOTE) Pre diabetes:          5.7%-6.4% Diabetes:              >6.4% Glycemic control for   <7.0% adults with diabetes     CBG: Recent Labs  Lab Dec 21, 2019 2031 12/20/19 0004 12/20/19 0409 12/20/19 0734  GLUCAP 130* 168* 175* 186*   CRITICAL CARE Performed by: Lanier Clam   Total critical care time: 50 minutes  Critical care time was exclusive of separately billable procedures and treating other patients.  Critical care was  necessary to treat or prevent imminent or life-threatening deterioration.  Critical care was time spent personally by me on the following activities: development of treatment plan with patient and/or surrogate as well as nursing, discussions with consultants, evaluation of patient's response to treatment, examination of patient, obtaining history from patient or surrogate, ordering and performing treatments and interventions, ordering and review of laboratory studies, ordering and review of radiographic studies, pulse oximetry and re-evaluation of patient's condition.  Tessie Fass MSN, AGACNP-BC [facility] [phone] If no answer, [phone] 12/20/2019, 9:05 AM

## 2019-12-20 NOTE — Progress Notes (Signed)
eLink Physician-Brief Progress Note Patient Name: Nathaniel Casey DOB: 1966-01-23 MRN: [id number]   Date of Service  12/20/2019  HPI/Events of Note  T 100.2, HR 133 (ST), BP 143/102.  Patient also with increasing post-hypoxic myoclonus.   eICU Interventions  Ordered acetaminophen PO, cooling blanket, and neurology consult for myoclonus and post-arrest care.     Intervention Category Intermediate Interventions: Other:  [name] 12/20/2019, 4:06 AM

## 2019-12-20 NOTE — Progress Notes (Signed)
  Echocardiogram 2D Echocardiogram has been performed.  Nathaniel Casey F 12/20/2019, 11:11 AM

## 2019-12-20 NOTE — Progress Notes (Signed)
[facility] ADULT ICU REPLACEMENT PROTOCOL FOR AM LAB REPLACEMENT ONLY  The patient does apply for the [facility] Adult ICU Electrolyte Replacment Protocol based on the criteria listed below:   1. Is GFR >/= 40 ml/min? Yes.    Patient's GFR today is >60 2. Is urine output >/= 0.5 ml/kg/hr for the last 6 hours? Yes.   Patient's UOP is 1.4 ml/kg/hr 3. Is BUN < 60 mg/dL? Yes.    Patient's BUN today is 12 4. Abnormal electrolyte(s): Mag-1.5 5. Ordered repletion with: per protocol 6. If a panic level lab has been reported, has the CCM MD in charge been notified? Yes.  .   Physician:  Dr. Edmon Crape, [place] Boos 12/20/2019 5:44 AM

## 2019-12-20 NOTE — Progress Notes (Signed)
PHARMACY NOTE:  ANTIMICROBIAL DOSAGE ADJUSTMENT  Current antimicrobial regimen includes a mismatch between antimicrobial dosage.  As per policy approved by the Pharmacy & Therapeutics and Medical Executive Committees, the antimicrobial dosage will be adjusted accordingly.  Current antimicrobial dosage:  Ceftriaxone 1g IM q24h  Indication: CAP   Antimicrobial dosage has been changed to:  Ceftriaxone 2g IV q24h  Additional comments: Change to IV route, increase dose in ICU patient   Babs Bertin, PharmD, BCPS Please check AMION for all [facility] contact numbers Clinical Pharmacist 12/20/2019 7:51 AM

## 2019-12-20 NOTE — Progress Notes (Signed)
PCCM Family Communication Note  After multiple attempts, able to reach patient's emergency contact - patient's mother. Provided updates RE clinical course and ongoing concerns. All questions answered. Patient is notably very appreciative for conversation with Dr. Amada Jupiter earlier today and expresses thanks for neurology's help in caring for this patient.    Tessie Fass MSN, AGACNP-BC  Pulmonary/Critical Care Medicine [phone] If no answer, [phone] 12/20/2019, 3:59 PM

## 2019-12-20 NOTE — Consult Note (Signed)
Neurology Consultation Reason for Consult: Concern for anoxic brain injury Referring Physician: Paralee Cancel  CC: Unresponsiveness  History is obtained from: Chart  HPI: Nathaniel Casey is a 54 y.o. male with unclear past medical history he lives alone and activated 911 last night due to respiratory difficulties.  He was found apparently try to get to his inhaler, but suffered a respiratory arrest.  He was down for approximately 20 minutes.  Last night, there was report of myoclonus which was treated with propofol with cessation.  No myoclonus has been seen today.   ROS:  Unable to obtain due to altered mental status.   Past Medical History:  Diagnosis Date  . Arthritis      Family history: Unable to obtain due to altered mental status.   Social History:  reports that he has never smoked. He does not have any smokeless tobacco history on file. He reports that he does not drink alcohol or use drugs.   Exam: Current vital signs: BP 96/68   Pulse 78   Temp 97.9 F (36.6 C)   Resp (!) 22   Ht 5\' 7"  (1.702 m)   Wt 68.8 kg   SpO2 99%   BMI 23.76 kg/m  Vital signs in last 24 hours: Temp:  [92.3 F (33.5 C)-100.2 F (37.9 C)] 97.9 F (36.6 C) (02/21 1230) Pulse Rate:  [55-133] 78 (02/21 1230) Resp:  [9-31] 22 (02/21 1230) BP: (91-192)/(62-135) 96/68 (02/21 1230) SpO2:  [98 %-100 %] 99 % (02/21 1230) FiO2 (%):  [40 %-100 %] 40 % (02/21 1159) Weight:  [68.8 kg-85 kg] 68.8 kg (02/21 0420)   Physical Exam  Constitutional: Appears well-developed and well-nourished.  Psych: Unresponsive Eyes: No scleral injection HENT: ET tube in place MSK: no joint deformities.  Cardiovascular: Normal rate and regular rhythm.  Respiratory: Ventilated GI: Soft.  No distension. There is no tenderness.  Skin: WDI  Neuro: Mental Status: Does not open eyes or follow commands Cranial Nerves: II:  Pupils are equal, round, and reactive to light.   III,IV, VI: eyes are conjugate V:VII:  corneal is absent on the left, weak on the right.  X: cough present Motor: Extensor posturing bilaterally tactile stimulation in the arms and legs Sensory: As above Cerebellar: Does not perform   I have reviewed labs in epic and the results pertinent to this consultation are: Hypocalcemia at 7.5 with albumin of 3.4.  I have reviewed the images obtained: Initial CT head was negative  EEG with burst suppression, though unclear in the setting of high doses of propofol  Impression: 54 year old male with likely diffuse anoxic injury.  I am not sure if the movements seen overnight were actually myoclonus versus extensor posturing as I did not witness them, but certainly his current exam and EEG are concerning for poor prognosis.  I do not think we can be definitive yet, given his high dose of propofol, but I would favor weaning this while on EEG monitoring.  I discussed my concerns with his mother who expressed understanding.  Recommendations: 1) overnight EEG 2) wean propofol as tolerated 3) repeat head CT in the morning   This patient is critically ill and at significant risk of neurological worsening, death and care requires constant monitoring of vital signs, hemodynamics,respiratory and cardiac monitoring, neurological assessment, discussion with family, other specialists and medical decision making of high complexity. I spent 35 minutes of neurocritical care time  in the care of  this patient. This was time spent independent  of any time provided by nurse practitioner or PA.  Roland Rack, MD [facility] [phone]  If 7pm- 7am, please page neurology on call as listed in [place]. 12/20/2019  1:21 PM

## 2019-12-20 NOTE — Progress Notes (Signed)
LTM EEG hooked up and running - no initial skin breakdown - push button tested - neuro notified.  

## 2019-12-21 LAB — GLUCOSE, CAPILLARY
Glucose-Capillary: 116 mg/dL — ABNORMAL HIGH (ref 70–99)
Glucose-Capillary: 122 mg/dL — ABNORMAL HIGH (ref 70–99)
Glucose-Capillary: 124 mg/dL — ABNORMAL HIGH (ref 70–99)
Glucose-Capillary: 138 mg/dL — ABNORMAL HIGH (ref 70–99)
Glucose-Capillary: 139 mg/dL — ABNORMAL HIGH (ref 70–99)
Glucose-Capillary: 162 mg/dL — ABNORMAL HIGH (ref 70–99)
Glucose-Capillary: 165 mg/dL — ABNORMAL HIGH (ref 70–99)

## 2019-12-21 LAB — BASIC METABOLIC PANEL
Anion gap: 10 (ref 5–15)
BUN: 18 mg/dL (ref 6–20)
CO2: 21 mmol/L — ABNORMAL LOW (ref 22–32)
Calcium: 8.2 mg/dL — ABNORMAL LOW (ref 8.9–10.3)
Chloride: 108 mmol/L (ref 98–111)
Creatinine, Ser: 1.07 mg/dL (ref 0.61–1.24)
GFR calc Af Amer: >60 mL/min (ref >60)
GFR calc non Af Amer: >60 mL/min (ref >60)
Glucose, Bld: 152 mg/dL — ABNORMAL HIGH (ref 70–99)
Potassium: 4.6 mmol/L (ref 3.5–5.1)
Sodium: 139 mmol/L (ref 135–145)

## 2019-12-21 LAB — CBC
HCT: 41.5 % (ref 39.0–52.0)
Hemoglobin: 13.7 g/dL (ref 13.0–17.0)
MCH: 30.1 pg (ref 26.0–34.0)
MCHC: 33 g/dL (ref 30.0–36.0)
MCV: 91.2 fL (ref 80.0–100.0)
Platelets: 290 10*3/uL (ref 150–400)
RBC: 4.55 MIL/uL (ref 4.22–5.81)
RDW: 13.9 % (ref 11.5–15.5)
WBC: 21.4 10*3/uL — ABNORMAL HIGH (ref 4.0–10.5)
nRBC: 0 % (ref 0.0–0.2)

## 2019-12-21 LAB — PHOSPHORUS
Phosphorus: 3 mg/dL (ref 2.5–4.6)
Phosphorus: 3.1 mg/dL (ref 2.5–4.6)

## 2019-12-21 LAB — MAGNESIUM: Magnesium: 2.5 mg/dL — ABNORMAL HIGH (ref 1.7–2.4)

## 2019-12-21 MED ORDER — VITAL AF 1.2 CAL PO LIQD
1000.0000 mL | ORAL | Status: DC
  Administered 2019-12-21 – 2019-12-27 (×8): 1000 mL

## 2019-12-21 MED ORDER — AMLODIPINE BESYLATE 5 MG PO TABS
5.0000 mg | ORAL_TABLET | Freq: Every day | ORAL | Status: DC
  Administered 2019-12-21 – 2019-12-28 (×8): 5 mg via ORAL
  Filled 2019-12-21 (×8): qty 1

## 2019-12-21 MED ORDER — SCOPOLAMINE 1 MG/3DAYS TD PT72
1.0000 | MEDICATED_PATCH | TRANSDERMAL | Status: DC
  Administered 2019-12-21 – 2019-12-27 (×3): 1.5 mg via TRANSDERMAL
  Filled 2019-12-21 (×3): qty 1

## 2019-12-21 NOTE — Progress Notes (Signed)
NAME:  Nathaniel Casey, MRN:  [id number], DOB:  23-Jan-1966, LOS: 2 ADMISSION DATE:  01-14-2020, CONSULTATION DATE:  REFERRING MD:  ED, CHIEF COMPLAINT:  Cardiac arrest   Brief History   Out of hospital cardiac arrest  History of present illness   We have essentially no history on this 54 year old who called EMS because he was short of breath.  He was found down with a inhaler in his hand.  He was in PEA arrest.  He underwent 8 minutes of CPR before ROSC.  Past Medical History  Unknown  Significant Hospital Events   2/20 Intubated in the department of emergency medicine on 2/20 using etomidate and rocuronium. CT H without acute abnormality. Myoclonic jerking noted at bedside, started on propofol  2/21 ongoing rhythmic jerking, increasing when propofol paused. Propofol resumed.  2/22 propofol off, followed some commands for neurology  Consults:  Neurology   Procedures:  2/20 ETT> Significant Diagnostic Tests:  Head CT 2/20> no acute intracranial abnormality   Micro Data:  Blood and sputum cultures were obtained in the emergency room on 2/20  Antimicrobials:  Azithromycin and Rocephin  Interim history/subjective:  No events, does not respond to commands for me, does trigger vent.  Objective   Blood pressure (!) 164/101, pulse (!) 110, temperature 99 F (37.2 C), resp. rate (!) 22, height 5\' 7"  (1.702 m), weight 68.5 kg, SpO2 99 %.    Vent Mode: PRVC FiO2 (%):  [40 %] 40 % Set Rate:  [22 bmp] 22 bmp Vt Set:  [520 mL] 520 mL PEEP:  [5 cmH20] 5 cmH20 Plateau Pressure:  [16 cmH20-26 cmH20] 16 cmH20   Intake/Output Summary (Last 24 hours) at 12/21/2019 1051 Last data filed at 12/21/2019 0930 Gross per 24 hour  Intake 3523.53 ml  Output 1250 ml  Net 2273.53 ml   Filed Weights   01/14/20 2000 12/20/19 0420 12/21/19 0432  Weight: 68.8 kg 68.8 kg 68.5 kg    Examination: GEN: ill appearing man on vent HEENT: ETT in place, copious oral secretions CV: RRR, ext  warm PULM: Scattered rhonci, no accessory muscle sue GI: Soft, +BS EXT: No edema NEURO: gags on tube to stimulation, I could not get him to follow commands, occasionally looks to do decerebrate posturing PSYCH: cannot assess SKIN: no rashes   Resolved Hospital Problem list     Assessment & Plan:  This is a 54 year old with unknown past medical history who called the EMS for dyspnea and was found down and in PEA.  Total downtime is not known. Unfortunately PMH is largely unknown at this time, with only known home med being albuterol.  # Cardiac arrest, PEA thought resp in origin.  Echo benign. - LTVEEG, appreciate neuro help - MRI likely tomorrow - supportive care x 72 hours then can comment on prognosticating  # Probable COPD in flare - Nebs, steroids as ordered  # Various electrolyte abnormalities, AKI- resolved, continue LR for another day     Best practice:  Diet: N.p.o. Pain/Anxiety/Delirium protocol (if indicated): Propofol VAP protocol (if indicated): Ordered DVT prophylaxis: SCD SQH GI prophylaxis: Protonix Glucose control: SSI Mobility: Bedrest Code Status: Full Family Communication: Updated 2/21 by 3/21 Disposition: ICU   The patient is critically ill with multiple organ systems failure and requires high complexity decision making for assessment and support, frequent evaluation and titration of therapies, application of advanced monitoring technologies and extensive interpretation of multiple databases. Critical Care Time devoted to patient care services described in this note  independent of APP/resident time (if applicable)  is 31 minutes.   Erskine Emery MD [facility] 12/21/2019 10:57 AM Personal pager: [phone] If unanswered, please page CCM On-call: [phone]

## 2019-12-21 NOTE — Progress Notes (Signed)
Initial Nutrition Assessment  DOCUMENTATION CODES:   Not applicable  INTERVENTION:   To better meet estimated nutrition needs, change TF:   Vital AF 1.2 at 65 ml/h (1560 ml per day)   D/C Pro-stat   Provides 1872 kcal, 117 gm protein, 1265 ml free water daily  NUTRITION DIAGNOSIS:   Inadequate oral intake related to inability to eat as evidenced by NPO status.  GOAL:   Patient will meet greater than or equal to 90% of their needs  MONITOR:   Vent status, TF tolerance, Labs  REASON FOR ASSESSMENT:   Ventilator, Consult Enteral/tube feeding initiation and management  ASSESSMENT:   54 yo male admitted S/P PEA arrest. PMH includes arthritis.   Discussed patient in ICU rounds and with RN today. Continuous EEG ongoing. Plans for MRI tomorrow. Sedation off this morning. Received MD Consult for TF initiation and management. Adult TF protocol ordered. Currently receiving Vital High Protein at 40 ml/h with Pro-stat 30 ml BID to provide 1160 kcal, 114 gm protein, 803 ml free water daily.   Patient is currently intubated on ventilator support MV: 10.9 L/min Temp (24hrs), Avg:98.7 F (37.1 C), Min:97.3 F (36.3 C), Max:99.9 F (37.7 C)  Propofol: none  Labs reviewed. Mag 2.5  CBG's: 116-139-122-165  Medications reviewed and include Novolog, Solu-medrol. Propofol off since this morning.    NUTRITION - FOCUSED PHYSICAL EXAM:    Most Recent Value  Orbital Region  No depletion  Upper Arm Region  No depletion  Thoracic and Lumbar Region  No depletion  Buccal Region  Unable to assess  Temple Region  No depletion  Clavicle Bone Region  No depletion  Clavicle and Acromion Bone Region  No depletion  Scapular Bone Region  No depletion  Dorsal Hand  No depletion  Patellar Region  No depletion  Anterior Thigh Region  No depletion  Posterior Calf Region  No depletion  Edema (RD Assessment)  None  Hair  Reviewed  Eyes  Unable to assess  Mouth  Unable to assess   Skin  Reviewed  Nails  Reviewed       Diet Order:   Diet Order            Diet NPO time specified  Diet effective now              EDUCATION NEEDS:   Not appropriate for education at this time  Skin:  Skin Assessment: Reviewed RN Assessment  Last BM:  no BM documented  Height:   Ht Readings from Last 1 Encounters:  12/31/2019 5\' 7"  (1.702 m)    Weight:   Wt Readings from Last 1 Encounters:  12/21/19 68.5 kg    Ideal Body Weight:  67.3 kg  BMI:  Body mass index is 23.65 kg/m.  Estimated Nutritional Needs:   Kcal:  1840  Protein:  100-115 gm  Fluid:  >/= 1.9 L    12/23/19, RD, LDN, CNSC Please refer to Amion for contact information.

## 2019-12-21 NOTE — Procedures (Addendum)
Patient Name: ERI PLATTEN  MRN: [id number]  Epilepsy Attending: Charlsie Quest  Referring Physician/Provider: Dr Ritta Slot Duration: 12/20/2019 1208 to 12/21/2019 1208  Patient history: 54 year old male with likely diffuse anoxic injury, overnight had movements myoclonus versus extensor posturing concerning for seizure. EEG to evaluate for seizure.  Level of alertness: comatose  AEDs during EEG study: propofol  Technical aspects: This EEG study was done with scalp electrodes positioned according to the 10-20 International system of electrode placement. Electrical activity was acquired at a sampling rate of 500Hz  and reviewed with a high frequency filter of 70Hz  and a low frequency filter of 1Hz . EEG data were recorded continuously and digitally stored.   DESCRIPTION: EEG showed continuous generalized low amplitude 5-8hz  theta-alpha slowing. Intermittent 2-3Hz  generalized delta slowing was also noted. Gradually, eeg showed generalized 8-9Hz  alpha activity. Reactivity was not tested during this study. Hyperventilation and photic stimulation were not performed.  ABNORMALITY - Continuous slow, generalized - Intermittent slow, generalized  IMPRESSION: This study is suggestive of profound diffuse encephalopathy, non specific to etiology but could be secondary to sedation, hypoxic/anoxic injury. No seizures or epileptiform discharges were seen throughout the recording.     Glen Blatchley 

## 2019-12-21 NOTE — Progress Notes (Addendum)
Neurology Progress Note   S:// Seen and examined this morning Was on propofol which was held for the exam.   O:// Current vital signs: BP 115/67   Pulse 68   Temp 98.6 F (37 C)   Resp (!) 22   Ht 5' 7"  (1.702 m)   Wt 68.5 kg   SpO2 100%   BMI 23.65 kg/m  Vital signs in last 24 hours: Temp:  [97.2 F (36.2 C)-99.9 F (37.7 C)] 98.6 F (37 C) (02/22 0700) Pulse Rate:  [68-108] 68 (02/22 0700) Resp:  [9-24] 22 (02/22 0700) BP: (91-147)/(62-103) 115/67 (02/22 0700) SpO2:  [98 %-100 %] 100 % (02/22 0837) FiO2 (%):  [40 %] 40 % (02/22 0837) Weight:  [68.5 kg] 68.5 kg (02/22 0432) Neurological exam On sedation with propofol which was held for the exam. Opens eyes to noxious stimulation. Follows commands in all 4 extremities.  Nonverbal due to the tube. Cranial nerves: Pupils equal round reactive to light, extraocular movements appear intact, visual fields appear full to threat but is inconsistent, difficult to ascertain facial symmetry. Motor exam: Is able to wiggle his toes and move his fingers on command.  Did not go antigravity in any of the 4 extremities. Sensory exam: Shows withdrawal to noxious stimulation in the upper extremities.  Also exhibits intermittent extensor posturing of the upper extremities while trying to follow commands. Coordination difficult to assess   Medications  Current Facility-Administered Medications:  .  0.9 %  sodium chloride infusion, , Intravenous, Continuous, Candee Furbish, MD, Last Rate: 10 mL/hr at 12/21/19 0700, Rate Verify at 12/21/19 0700 .  acetaminophen (TYLENOL) 160 MG/5ML solution 650 mg, 650 mg, Per Tube, Q4H PRN, Sampson Goon, MD .  azithromycin [facility]) 500 mg in sodium chloride 0.9 % 250 mL IVPB, 500 mg, Intravenous, Q24H, Sampson Goon, MD, Stopped at 12/20/19 1855 .  bisacodyl (DULCOLAX) suppository 10 mg, 10 mg, Rectal, Daily PRN, Bowser, Laurel Dimmer, NP .  chlorhexidine gluconate (MEDLINE KIT) (PERIDEX) 0.12 % solution  15 mL, 15 mL, Mouth Rinse, BID, Sampson Goon, MD, 15 mL at 12/20/19 2029 .  Chlorhexidine Gluconate Cloth 2 % PADS 6 each, 6 each, Topical, Daily, Candee Furbish, MD, 6 each at 12/21/19 0254 .  docusate (COLACE) 50 MG/5ML liquid 100 mg, 100 mg, Per Tube, BID PRN, Bowser, Laurel Dimmer, NP .  feeding supplement (PRO-STAT SUGAR FREE 64) liquid 30 mL, 30 mL, Per Tube, BID, Candee Furbish, MD, 30 mL at 12/20/19 2239 .  feeding supplement (VITAL HIGH PROTEIN) liquid 1,000 mL, 1,000 mL, Per Tube, Q24H, Candee Furbish, MD, 1,000 mL at 12/20/19 1259 .  fentaNYL (SUBLIMAZE) injection 50 mcg, 50 mcg, Intravenous, Q15 min PRN, Bowser, Grace E, NP .  fentaNYL (SUBLIMAZE) injection 50-200 mcg, 50-200 mcg, Intravenous, Q30 min PRN, Bowser, Grace E, NP .  heparin injection 5,000 Units, 5,000 Units, Subcutaneous, Q8H, Stretch, Marily Lente, MD, 5,000 Units at 12/21/19 [phone] .  insulin aspart (novoLOG) injection 0-9 Units, 0-9 Units, Subcutaneous, Q4H, Candee Furbish, MD, 1 Units at 12/21/19 0451 .  lactated ringers infusion, , Intravenous, Continuous, Candee Furbish, MD, Last Rate: 75 mL/hr at 12/21/19 0700, Rate Verify at 12/21/19 0700 .  MEDLINE mouth rinse, 15 mL, Mouth Rinse, 10 times per day, Sampson Goon, MD, 15 mL at 12/21/19 0556 .  methylPREDNISolone sodium succinate (SOLU-MEDROL) 125 mg/2 mL injection 60 mg, 60 mg, Intravenous, Q12H, 60 mg at 12/20/19 2239 **FOLLOWED BY** [START ON  12/22/2019] methylPREDNISolone sodium succinate (SOLU-MEDROL) 125 mg/2 mL injection 60 mg, 60 mg, Intravenous, Daily, Bowser, Grace E, NP .  midazolam (VERSED) injection 2 mg, 2 mg, Intravenous, Q15 min PRN, Bowser, Grace E, NP .  midazolam (VERSED) injection 2 mg, 2 mg, Intravenous, Q2H PRN, Bowser, Grace E, NP .  ondansetron (ZOFRAN) injection 4 mg, 4 mg, Intravenous, Q6H PRN, Bowser, Grace E, NP .  pantoprazole (PROTONIX) injection 40 mg, 40 mg, Intravenous, QHS, Bowser, Grace E, NP, 40 mg at 12/20/19 2239 .  propofol (DIPRIVAN)  1000 MG/100ML infusion, 5-80 mcg/kg/min, Intravenous, Continuous, Sampson Goon, MD, Last Rate: 8.26 mL/hr at 12/21/19 0700, 20 mcg/kg/min at 12/21/19 0700 Labs CBC    Component Value Date/Time   WBC 21.4 (H) 12/21/2019 0433   RBC 4.55 12/21/2019 0433   HGB 13.7 12/21/2019 0433   HCT 41.5 12/21/2019 0433   PLT 290 12/21/2019 0433   MCV 91.2 12/21/2019 0433   MCH 30.1 12/21/2019 0433   MCHC 33.0 12/21/2019 0433   RDW 13.9 12/21/2019 0433    CMP     Component Value Date/Time   NA 139 12/21/2019 0433   K 4.6 12/21/2019 0433   CL 108 12/21/2019 0433   CO2 21 (L) 12/21/2019 0433   GLUCOSE 152 (H) 12/21/2019 0433   BUN 18 12/21/2019 0433   CREATININE 1.07 12/21/2019 0433   CALCIUM 8.2 (L) 12/21/2019 0433   PROT 6.0 (L) 12/27/2019 1712   ALBUMIN 3.4 (L) 12/23/2019 1712   AST 37 12/10/2019 1712   ALT 24 12/09/2019 1712   ALKPHOS 65 12/18/2019 1712   BILITOT 0.8 12/26/2019 1712   GFRNONAA >60 12/21/2019 0433   GFRAA >60 12/21/2019 0433   Imaging I have reviewed images in epic and the results pertinent to this consultation are: CT head on arrival unremarkable for acute process.  Assessment: 54 year old man presented post cardiac arrest with a downtime of around 20 minutes with concern for myoclonic movements versus extensor posturing which were not earlier seen by the on-call neurologist due to patient being sedated but today on my examination, he was able to open his eyes to command and follows some commands well exhibiting the extensor posturing in his upper extremities. Overnight EEG shows generalized and intermittent slowing with no seizures. I will give him some time before making any prognostications as he is now following commands.  There still might be some hypoxic/ischemic insult.  Impression: Evaluate for hypoxic ischemic encephalopathy status post cardiac arrest  Recommendations: -LTM EEG can be continued for 1 more night as sedation is being weaned to ensure no  seizures or electrographic disturbance as the cause for altered mentation. -Try to reduce sedation as much as possible.  Use sedation only for vent synchrony.  No need for sedation from a neurological standpoint. -I would hold off on repeating head CT and probably do an MRI tomorrow to assess for any hypoxic ischemic changes. -Supportive care per primary team as you are.  Discussed with Dr. Amada Kingfisher attending.  -- Amie Portland, MD [facility] Pager: [phone] If 7pm to 7am, please call on call as listed on AMION.  CRITICAL CARE ATTESTATION Performed by: Amie Portland, MD Total critical care time: 35 minutes Critical care time was exclusive of separately billable procedures and treating other patients and/or supervising APPs/Residents/Students Critical care was necessary to treat or prevent imminent or life-threatening deterioration due to HIE This patient is critically ill and at significant risk for neurological worsening and/or death and care requires constant monitoring.  Critical care was time spent personally by me on the following activities: development of treatment plan with patient and/or surrogate as well as nursing, discussions with consultants, evaluation of patient's response to treatment, examination of patient, obtaining history from patient or surrogate, ordering and performing treatments and interventions, ordering and review of laboratory studies, ordering and review of radiographic studies, pulse oximetry, re-evaluation of patient's condition, participation in multidisciplinary rounds and medical decision making of high complexity in the care of this patient.

## 2019-12-21 NOTE — Progress Notes (Signed)
LTM maintenance completed; checked skin under Fp1 and Fp2; no breakdown was seen.

## 2019-12-22 LAB — BASIC METABOLIC PANEL
Anion gap: 11 (ref 5–15)
BUN: 24 mg/dL — ABNORMAL HIGH (ref 6–20)
CO2: 22 mmol/L (ref 22–32)
Calcium: 8.4 mg/dL — ABNORMAL LOW (ref 8.9–10.3)
Chloride: 104 mmol/L (ref 98–111)
Creatinine, Ser: 0.91 mg/dL (ref 0.61–1.24)
GFR calc Af Amer: >60 mL/min (ref >60)
GFR calc non Af Amer: >60 mL/min (ref >60)
Glucose, Bld: 168 mg/dL — ABNORMAL HIGH (ref 70–99)
Potassium: 4.3 mmol/L (ref 3.5–5.1)
Sodium: 137 mmol/L (ref 135–145)

## 2019-12-22 LAB — CBC
HCT: 40.3 % (ref 39.0–52.0)
Hemoglobin: 13.3 g/dL (ref 13.0–17.0)
MCH: 29.6 pg (ref 26.0–34.0)
MCHC: 33 g/dL (ref 30.0–36.0)
MCV: 89.8 fL (ref 80.0–100.0)
Platelets: 270 10*3/uL (ref 150–400)
RBC: 4.49 MIL/uL (ref 4.22–5.81)
RDW: 13.7 % (ref 11.5–15.5)
WBC: 21.6 10*3/uL — ABNORMAL HIGH (ref 4.0–10.5)
nRBC: 0 % (ref 0.0–0.2)

## 2019-12-22 LAB — GLUCOSE, CAPILLARY
Glucose-Capillary: 134 mg/dL — ABNORMAL HIGH (ref 70–99)
Glucose-Capillary: 138 mg/dL — ABNORMAL HIGH (ref 70–99)
Glucose-Capillary: 140 mg/dL — ABNORMAL HIGH (ref 70–99)
Glucose-Capillary: 145 mg/dL — ABNORMAL HIGH (ref 70–99)
Glucose-Capillary: 147 mg/dL — ABNORMAL HIGH (ref 70–99)
Glucose-Capillary: 170 mg/dL — ABNORMAL HIGH (ref 70–99)

## 2019-12-22 LAB — HIV ANTIBODY (ROUTINE TESTING W REFLEX): HIV Screen 4th Generation wRfx: NONREACTIVE — AB

## 2019-12-22 MED ORDER — SODIUM CHLORIDE 0.9 % IV SOLN
3000.0000 mg | INTRAVENOUS | Status: AC
  Administered 2019-12-22: 3000 mg via INTRAVENOUS
  Filled 2019-12-22: qty 30

## 2019-12-22 MED ORDER — LEVETIRACETAM IN NACL 1000 MG/100ML IV SOLN
1000.0000 mg | Freq: Two times a day (BID) | INTRAVENOUS | Status: DC
  Administered 2019-12-23 – 2019-12-29 (×15): 1000 mg via INTRAVENOUS
  Filled 2019-12-22 (×16): qty 100

## 2019-12-22 NOTE — Progress Notes (Signed)
Neurology Progress Note   S:// Patient seen and examined.  Off of propofol since yesterday.  Not responsive in a meaningful way according to overnight RN   O:// Current vital signs: BP 138/81   Pulse 64   Temp 98.8 F (37.1 C) (Oral)   Resp (!) 22   Ht 5' 7"  (1.702 m)   Wt 69.6 kg   SpO2 98%   BMI 24.03 kg/m  Vital signs in last 24 hours: Temp:  [98.4 F (36.9 C)-99.1 F (37.3 C)] 98.8 F (37.1 C) (02/23 0352) Pulse Rate:  [63-126] 64 (02/23 0600) Resp:  [17-24] 22 (02/23 0600) BP: (125-181)/(75-115) 138/81 (02/23 0600) SpO2:  [94 %-100 %] 98 % (02/23 0600) FiO2 (%):  [40 %] 40 % (02/23 0345) Weight:  [69.6 kg] 69.6 kg (02/23 0349) Neurological exam Off of sedation remains intubated Breathing over the ventilator Pupils round and reactive to light, equal. Corneals present Cough and gag present Does not nod yes or no Opens eyes to noxious stimulation Did not follow commands in the arms or legs today Intermittent extension of bilateral arms to noxious stimulation Mild reactivity to pain in the lower extremities but no withdrawal.  Not floridly extending or flexing the legs to noxious stimulation.  Medications  Current Facility-Administered Medications:  .  0.9 %  sodium chloride infusion, , Intravenous, Continuous, Candee Furbish, MD, Last Rate: 10 mL/hr at 12/22/19 0600, Rate Verify at 12/22/19 0600 .  acetaminophen (TYLENOL) 160 MG/5ML solution 650 mg, 650 mg, Per Tube, Q4H PRN, Sampson Goon, MD .  amLODipine (NORVASC) tablet 5 mg, 5 mg, Oral, Daily, Candee Furbish, MD, 5 mg at 12/21/19 1358 .  azithromycin (ZITHROMAX) 500 mg in sodium chloride 0.9 % 250 mL IVPB, 500 mg, Intravenous, Q24H, Sampson Goon, MD, Stopped at 12/21/19 1927 .  bisacodyl (DULCOLAX) suppository 10 mg, 10 mg, Rectal, Daily PRN, Bowser, Laurel Dimmer, NP .  chlorhexidine gluconate (MEDLINE KIT) (PERIDEX) 0.12 % solution 15 mL, 15 mL, Mouth Rinse, BID, Sampson Goon, MD, 15 mL at 12/21/19 1942 .   Chlorhexidine Gluconate Cloth 2 % PADS 6 each, 6 each, Topical, Daily, Candee Furbish, MD, 6 each at 12/21/19 2216 .  docusate (COLACE) 50 MG/5ML liquid 100 mg, 100 mg, Per Tube, BID PRN, Bowser, Laurel Dimmer, NP .  feeding supplement (VITAL AF 1.2 CAL) liquid 1,000 mL, 1,000 mL, Per Tube, Continuous, Candee Furbish, MD, Last Rate: 65 mL/hr at 12/22/19 0600, 1,000 mL at 12/22/19 0600 .  fentaNYL (SUBLIMAZE) injection 50 mcg, 50 mcg, Intravenous, Q15 min PRN, Bowser, Grace E, NP .  fentaNYL (SUBLIMAZE) injection 50-200 mcg, 50-200 mcg, Intravenous, Q30 min PRN, Bowser, Grace E, NP .  heparin injection 5,000 Units, 5,000 Units, Subcutaneous, Q8H, Stretch, Marily Lente, MD, 5,000 Units at 12/22/19 0539 .  insulin aspart (novoLOG) injection 0-9 Units, 0-9 Units, Subcutaneous, Q4H, Candee Furbish, MD, 1 Units at 12/22/19 [phone] .  lactated ringers infusion, , Intravenous, Continuous, Candee Furbish, MD, Last Rate: 75 mL/hr at 12/22/19 0600, Rate Verify at 12/22/19 0600 .  MEDLINE mouth rinse, 15 mL, Mouth Rinse, 10 times per day, Sampson Goon, MD, 15 mL at 12/22/19 0539 .  [COMPLETED] methylPREDNISolone sodium succinate (SOLU-MEDROL) 125 mg/2 mL injection 60 mg, 60 mg, Intravenous, Q12H, 60 mg at 12/21/19 2214 **FOLLOWED BY** methylPREDNISolone sodium succinate (SOLU-MEDROL) 125 mg/2 mL injection 60 mg, 60 mg, Intravenous, Daily, Bowser, Grace E, NP .  midazolam (VERSED) injection 2 mg, 2  mg, Intravenous, Q15 min PRN, Bowser, Laurel Dimmer, NP .  midazolam (VERSED) injection 2 mg, 2 mg, Intravenous, Q2H PRN, Bowser, Grace E, NP .  ondansetron (ZOFRAN) injection 4 mg, 4 mg, Intravenous, Q6H PRN, Bowser, Grace E, NP .  pantoprazole (PROTONIX) injection 40 mg, 40 mg, Intravenous, QHS, Bowser, Grace E, NP, 40 mg at 12/21/19 2214 .  propofol (DIPRIVAN) 1000 MG/100ML infusion, 5-80 mcg/kg/min, Intravenous, Continuous, Sampson Goon, MD, Stopped at 12/21/19 0840 .  scopolamine (TRANSDERM-SCOP) 1 MG/3DAYS 1.5 mg, 1 patch,  Transdermal, Q72H, Candee Furbish, MD, 1.5 mg at 12/21/19 1257 Labs CBC    Component Value Date/Time   WBC 21.6 (H) 12/22/2019 0112   RBC 4.49 12/22/2019 0112   HGB 13.3 12/22/2019 0112   HCT 40.3 12/22/2019 0112   PLT 270 12/22/2019 0112   MCV 89.8 12/22/2019 0112   MCH 29.6 12/22/2019 0112   MCHC 33.0 12/22/2019 0112   RDW 13.7 12/22/2019 0112    CMP     Component Value Date/Time   NA 137 12/22/2019 0112   K 4.3 12/22/2019 0112   CL 104 12/22/2019 0112   CO2 22 12/22/2019 0112   GLUCOSE 168 (H) 12/22/2019 0112   BUN 24 (H) 12/22/2019 0112   CREATININE 0.91 12/22/2019 0112   CALCIUM 8.4 (L) 12/22/2019 0112   PROT 6.0 (L) 12/13/2019 1712   ALBUMIN 3.4 (L) 12/11/2019 1712   AST 37 12/21/2019 1712   ALT 24 12/27/2019 1712   ALKPHOS 65 12/24/2019 1712   BILITOT 0.8 12/14/2019 1712   GFRNONAA >60 12/22/2019 0112   GFRAA >60 12/22/2019 0112   Imaging I have reviewed images in epic and the results pertinent to this consultation are: CT on arrival unremarkable.  Assessment: 54 year old man presented post cardiac arrest with a downtime of around 20 minutes, with initial concern for myoclonic movements versus extensor posturing, not witnessed initially but on lowering sedation noted by me yesterday and today as well intermittently. Yesterday he did follow some commands-wiggle his toes to command but today he did not. He is up and off of propofol now for about 24 hours. He did open his eyes to noxious stimulation in his all his brainstem reflexes intact. 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 -After the leads are removed, we will plan on doing an MRI brain without contrast -Keep on holding sedating medications as much as  possible -Supportive care per primary team as you are -Neurology will follow with you. -D/W Dr. Tamala Julian, PCCM -- Amie Portland, MD [facility] Pager: [phone] If 7pm to 7am, please call on call as listed on AMION.  CRITICAL CARE ATTESTATION Performed by: Amie Portland, MD Total critical care time: 32 minutes Critical care time was exclusive of separately billable procedures and treating other patients and/or supervising APPs/Residents/Students Critical care was necessary to treat or prevent imminent or life-threatening deterioration due to hypoxic ischemic encephalopathy, possible anoxic brain injury  This patient is critically ill and at significant risk for neurological worsening and/or death and care requires constant monitoring. Critical care was time spent personally by me on the following activities: development of treatment plan with patient and/or surrogate as well as nursing, discussions with consultants, evaluation of patient's response to treatment, examination of patient, obtaining history from patient or surrogate, ordering and performing treatments  and interventions, ordering and review of laboratory studies, ordering and review of radiographic studies, pulse oximetry, re-evaluation of patient's condition, participation in multidisciplinary rounds and medical decision making of high complexity in the care of this patient.

## 2019-12-22 NOTE — Progress Notes (Signed)
NAME:  Nathaniel Casey, MRN:  [id number], DOB:  03/16/1966, LOS: 3 ADMISSION DATE:  12/23/2019, CONSULTATION DATE:  REFERRING MD:  ED, CHIEF COMPLAINT:  Cardiac arrest   Brief History   Out of hospital cardiac arrest  History of present illness   We have essentially no history on this 54 year old who called EMS because he was short of breath.  He was found down with a inhaler in his hand.  He was in PEA arrest.  He underwent 8 minutes of CPR before ROSC.  Past Medical History  Unknown  Significant Hospital Events   2/20 Intubated in the department of emergency medicine on 2/20 using etomidate and rocuronium. CT H without acute abnormality. Myoclonic jerking noted at bedside, started on propofol  2/21 ongoing rhythmic jerking, increasing when propofol paused. Propofol resumed.  2/22 propofol off, followed some commands for neurology  Consults:  Neurology   Procedures:  2/20 ETT> Significant Diagnostic Tests:  Head CT 2/20> no acute intracranial abnormality   Micro Data:  Blood and sputum cultures were obtained in the emergency room on 2/20  Antimicrobials:  Azithromycin and Rocephin  Interim history/subjective:  No events, comatose.  Objective   Blood pressure 124/70, pulse 75, temperature 98.4 F (36.9 C), temperature source Axillary, resp. rate (!) 22, height 5\' 7"  (1.702 m), weight 69.6 kg, SpO2 100 %.    Vent Mode: PRVC FiO2 (%):  [40 %] 40 % Set Rate:  [22 bmp] 22 bmp Vt Set:  [520 mL] 520 mL PEEP:  [5 cmH20] 5 cmH20 Plateau Pressure:  [17 cmH20-26 cmH20] 26 cmH20   Intake/Output Summary (Last 24 hours) at 12/22/2019 1346 Last data filed at 12/22/2019 1200 Gross per 24 hour  Intake 3906.04 ml  Output 1252 ml  Net 2654.04 ml   Filed Weights   12/20/19 0420 12/21/19 0432 12/22/19 0349  Weight: 68.8 kg 68.5 kg 69.6 kg    Examination: GEN: ill appearing man on vent HEENT: ETT in place, copious oral secretions CV: RRR, ext warm PULM: Scattered rhonci, no  accessory muscle sue GI: Soft, +BS EXT: No edema NEURO: continued GCS3 PSYCH: cannot assess SKIN: no rashes   Resolved Hospital Problem list     Assessment & Plan:  This is a 54 year old with unknown past medical history who called the EMS for dyspnea and was found down and in PEA.  Total downtime is not known. Unfortunately PMH is largely unknown at this time, with only known home med being albuterol.  # Cardiac arrest, PEA thought resp in origin.  Echo benign. - LTVEEG, appreciate neuro help - MRI likely today - supportive care x 72 hours then can comment on prognosticating, not looking good  # Probable COPD in flare - Nebs, steroids as ordered  # Various electrolyte abnormalities, AKI- resolved, continue LR for another day     Best practice:  Diet: TF Pain/Anxiety/Delirium protocol (if indicated): none VAP protocol (if indicated): Ordered DVT prophylaxis: SCD SQH GI prophylaxis: Protonix Glucose control: SSI Mobility: Bedrest Code Status: Full Family Communication: Updated 2/22 by me Disposition: ICU   The patient is critically ill with multiple organ systems failure and requires high complexity decision making for assessment and support, frequent evaluation and titration of therapies, application of advanced monitoring technologies and extensive interpretation of multiple databases. Critical Care Time devoted to patient care services described in this note independent of APP/resident time (if applicable)  is 32 minutes.   3/22 MD [facility] 12/22/2019 1:46 PM  Personal pager: [phone] If unanswered, please page CCM On-call: [phone]

## 2019-12-22 NOTE — Procedures (Addendum)
Patient Name: Nathaniel Casey  MRN: [id number]  Epilepsy Attending: Charlsie Quest  Referring Physician/Provider: Dr Ritta Slot Duration: 12/21/2019 1208 to 12/22/2019 1208  Patient history: 54 year old male with likely diffuse anoxic injury, overnight had movements myoclonus versus extensor posturing concerning for seizure. EEG to evaluate for seizure.  Level of alertness: comatose  AEDs during EEG study: None  Technical aspects: This EEG study was done with scalp electrodes positioned according to the 10-20 International system of electrode placement. Electrical activity was acquired at a sampling rate of 500Hz  and reviewed with a high frequency filter of 70Hz  and a low frequency filter of 1Hz . EEG data were recorded continuously and digitally stored.   DESCRIPTION: EEG showed continuous generalized low amplitude 8-9hz  alpha activity. Intermittent 5-7Hz  generalized theta slowing was also noted. EEG was reactive to tactile stimuli.   After around 6 AM on 12/22/2019: Intermittent generalized 13 to 15 Hz beta activity was noted which appeared to evolve into 5 to 6 Hz theta activity at times, lasting 10 to 20 seconds.  Occasionally, this beta activity was preceded by generalized epileptiform discharges with triphasic morphology, maximal bifrontal.  These discharges are concerning for ictal activity.  However now that sedation is turned off, this could be reactivity to stimulation.  Hyperventilation and photic stimulation were not performed.  ABNORMALITY - Alpha activity, generalized  IMPRESSION: This study is suggestive of profound diffuse encephalopathy, non specific to etiology but could be secondary to sedation, hypoxic/anoxic injury.   Intermittent beta activity evolving into theta activity was noted after around 6 AM on 12/22/2019.  This pattern is concerning for ictal activity.  However it is difficult to rule out if this is just reactivity to stimulation now that sedation has  been turned off.   EEG was discussed with Dr. .

## 2019-12-22 NOTE — Progress Notes (Signed)
Main. performed-Fp1 Fp2 F3 F4 checked - no skin breakdown

## 2019-12-22 NOTE — Progress Notes (Signed)
EEG with intermittent fast activity this AM. Exam technically somewhat worse than yesteday Question is there is some seizure activity underlying.  Updated Recs: Load with 3g of Keppra IV and then continue 1000mg  IV BID Continue LTM Hold off MRI for now.  -- , MD [facility] Pager: [phone] If 7pm to 7am, please call on call as listed on AMION.

## 2019-12-23 ENCOUNTER — Encounter (HOSPITAL_COMMUNITY): Payer: Self-pay | Admitting: Pulmonary Disease

## 2019-12-23 ENCOUNTER — Inpatient Hospital Stay (HOSPITAL_COMMUNITY): Payer: 59

## 2019-12-23 DIAGNOSIS — J9600 Acute respiratory failure, unspecified whether with hypoxia or hypercapnia: Secondary | ICD-10-CM

## 2019-12-23 DIAGNOSIS — G9340 Encephalopathy, unspecified: Secondary | ICD-10-CM

## 2019-12-23 LAB — CBC
HCT: 36.5 % — ABNORMAL LOW (ref 39.0–52.0)
Hemoglobin: 12.3 g/dL — ABNORMAL LOW (ref 13.0–17.0)
MCH: 29.8 pg (ref 26.0–34.0)
MCHC: 33.7 g/dL (ref 30.0–36.0)
MCV: 88.4 fL (ref 80.0–100.0)
Platelets: 262 10*3/uL (ref 150–400)
RBC: 4.13 MIL/uL — ABNORMAL LOW (ref 4.22–5.81)
RDW: 13.6 % (ref 11.5–15.5)
WBC: 17.9 10*3/uL — ABNORMAL HIGH (ref 4.0–10.5)
nRBC: 0 % (ref 0.0–0.2)

## 2019-12-23 LAB — GLUCOSE, CAPILLARY
Glucose-Capillary: 150 mg/dL — ABNORMAL HIGH (ref 70–99)
Glucose-Capillary: 125 mg/dL — ABNORMAL HIGH (ref 70–99)
Glucose-Capillary: 162 mg/dL — ABNORMAL HIGH (ref 70–99)
Glucose-Capillary: 155 mg/dL — ABNORMAL HIGH (ref 70–99)
Glucose-Capillary: 146 mg/dL — ABNORMAL HIGH (ref 70–99)
Glucose-Capillary: 114 mg/dL — ABNORMAL HIGH (ref 70–99)

## 2019-12-23 LAB — BASIC METABOLIC PANEL
Anion gap: 9 (ref 5–15)
BUN: 24 mg/dL — ABNORMAL HIGH (ref 6–20)
CO2: 26 mmol/L (ref 22–32)
Calcium: 8.4 mg/dL — ABNORMAL LOW (ref 8.9–10.3)
Chloride: 106 mmol/L (ref 98–111)
Creatinine, Ser: 0.94 mg/dL (ref 0.61–1.24)
GFR calc Af Amer: >60 mL/min (ref >60)
GFR calc non Af Amer: >60 mL/min (ref >60)
Glucose, Bld: 152 mg/dL — ABNORMAL HIGH (ref 70–99)
Potassium: 3.8 mmol/L (ref 3.5–5.1)
Sodium: 141 mmol/L (ref 135–145)

## 2019-12-23 MED ORDER — ARFORMOTEROL TARTRATE 15 MCG/2ML IN NEBU
15.0000 ug | INHALATION_SOLUTION | Freq: Two times a day (BID) | RESPIRATORY_TRACT | Status: DC
  Administered 2019-12-23 – 2019-12-28 (×10): 15 ug via RESPIRATORY_TRACT
  Filled 2019-12-23 (×12): qty 2

## 2019-12-23 MED ORDER — BUDESONIDE 0.5 MG/2ML IN SUSP
0.5000 mg | Freq: Two times a day (BID) | RESPIRATORY_TRACT | Status: DC
  Administered 2019-12-23 – 2019-12-28 (×10): 0.5 mg via RESPIRATORY_TRACT
  Filled 2019-12-23 (×10): qty 2

## 2019-12-23 NOTE — Progress Notes (Addendum)
NAME:  Nathaniel Casey, MRN:  [id number], DOB:  [birth year], LOS: 4 ADMISSION DATE:  2020-01-02, CONSULTATION DATE:  REFERRING MD:  ED, CHIEF COMPLAINT:  Cardiac arrest   Brief History   54 y/o M with no known medical history admitted 2/20 after calling EMS for SOB.  Found down with inhaler in hand / PEA Arrest.  Required 8 minutes CPR prior to ROSC. Concern for myoclonus on admit.   Past Medical History  Asthma - noncompliant with regimen per cousin  Never Smoker   Significant Hospital Events   2/20 Admit post PEA arrest  2/21 Ongoing rhythmic jerking, increasing when propofol paused. Propofol resumed.  2/22 Propofol off, followed some commands for neurology 2/24 Not following commands, remains off sedation.  EEG pending   Consults:  Neurology   Procedures:  ETT 2/20 >>  Significant Diagnostic Tests:   Head CT 2/20 > no acute intracranial abnormality   ECHO 2/21 >> LVEF 45-50%, LV regional wall motion abnormality, mild LVH, RV systolic function normal  EEG 6/83 >> severe diffuse encephalopathy, non-specific to etiology.  Could be anoxic injury. Episodes of twitching on 2/23 without concomitant EEG changes   Micro Data:  Influenza A/B 2/20 >> negative  COVID 2/20 >> negative  MRSA PCR 2/21 >> negative  RVP 2/20 >> negative  BCx2 2/20 >>   Antimicrobials:  Azithromycin 2/20 x1 Rocephin 2/20 x1   Interim history/subjective:  Afebrile / WBC 17 Remains on vent, PEEP 5 / FiO2 40% Glucose range 125-152 I/O - 1L UOP, 2.1L+ for 24h  Objective   Blood pressure (!) 159/86, pulse (!) 109, temperature 98.6 F (37 C), temperature source Oral, resp. rate (!) 22, height 5\' 7"  (1.702 m), weight 71.3 kg, SpO2 98 %.    Vent Mode: PRVC FiO2 (%):  [40 %] 40 % Set Rate:  [22 bmp] 22 bmp Vt Set:  [520 mL] 520 mL PEEP:  [5 cmH20] 5 cmH20 Plateau Pressure:  [16 cmH20-26 cmH20] 22 cmH20   Intake/Output Summary (Last 24 hours) at 12/23/2019 1031 Last data filed at 12/23/2019  1000 Gross per 24 hour  Intake 2980.16 ml  Output 1040 ml  Net 1940.16 ml   Filed Weights   12/21/19 0432 12/22/19 0349 12/23/19 0413  Weight: 68.5 kg 69.6 kg 71.3 kg    Examination: General: adult male lying in bed, critically ill appearing, abnormal motor movements noted HEENT: MM pink/moist, ETT in place, pupils 75mm Neuro: unresponsive, no follow commands, no spontaneous movement, + cough CV: s1s2 rrr, no m/r/g PULM:  Non-labored on vent, soft wheeze on right, clear on left  GI: soft, bsx4 active  Extremities: warm/dry, trace generalized edema  Skin: no rashes or lesions  Resolved Hospital Problem list   AKI  Hyopkalemia Electrolyte Disturbances   Assessment & Plan:   PEA Cardiac Arrest  Thought to be respiratory in origin given SOB complaints (reported hx of asthma, non-compliant).  Unknown downtime, required 8 minutes CPR prior to ROSC. ECHO benign.    -ICU monitoring   Acute Hypoxic Encephalopathy  Suspected Myoclonus  -appreciate Neurology input for neuro prognostication  -follow neuro exam closely  -assess MRI, per Neurology  -PRN fentanyl for comfort / evidence of pain  -continue keppra   Acute Respiratory Failure secondary to Cardiac Arrest  -PRVC 8cc/kg -wean PEEP / FiO2 for sats >90% -follow intermittent CXR  -ok to wean on PSV if tolerates, no plan for extubation    Suspected Asthma Flare  -continue solumedrol  -add  brovana + pulmicort   Hyperglycemia  -SSI, sensitive scale    At Risk Malnutrition -TF per Nutrition    At Risk Electrolyte Disturbances  -follow BMP, replace as indicated    Labs   Recent Labs  Lab 01-18-20 1712 01-18-20 1815 January 18, 2020 1907 01/18/2020 1907 12/20/19 0148 12/20/19 0148 12/20/19 0410 12/20/19 0410 12/20/19 4132 12/20/19 4401 12/20/19 1209 12/20/19 1617 12/20/19 2013 12/21/19 0433 12/21/19 0433 12/21/19 1645 12/22/19 0112 12/23/19 0312  NA 141   < >  --    < > 140   < > 130*  --  138  --   --   --    --  139  --   --  137 141  K 4.2   < >  --    < > 3.5   < > 3.3*   < > 3.6   < >  --   --   --  4.6   < >  --  4.3 3.8  CL 108  --   --   --  106  --   --   --   --   --   --   --   --  108  --   --  104 106  CO2 18*  --   --   --  21*  --   --   --   --   --   --   --   --  21*  --   --  22 26  GLUCOSE 288*  --   --   --  184*  --   --   --   --   --   --   --   --  152*  --   --  168* 152*  BUN 16  --   --   --  12  --   --   --   --   --   --   --   --  18  --   --  24* 24*  CREATININE 1.62*  --   --   --  1.16  --   --   --   --   --   --   --   --  1.07  --   --  0.91 0.94  CALCIUM 8.2*  --   --   --  7.5*  --   --   --   --   --   --   --   --  8.2*  --   --  8.4* 8.4*  MG  --   --  2.3  --  1.5*  --   --   --   --   --   --   --  2.9* 2.5*  --   --   --   --   PHOS  --   --  5.2*   < > 1.2*  --   --   --   --   --  3.6 2.6  --  3.0  --  3.1  --   --    < > = values in this interval not displayed.   Recent Labs  Lab 12/21/19 0433 12/22/19 0112 12/23/19 0312  HGB 13.7 13.3 12.3*  HCT 41.5 40.3 36.5*  WBC 21.4* 21.6* 17.9*  PLT 290 270 262     Best practice:  Diet: TF Pain/Anxiety/Delirium protocol (if indicated): PRN fentanyl low  dose VAP protocol (if indicated): Ordered DVT prophylaxis: SCD SQH GI prophylaxis: Protonix Glucose control: SSI Mobility: Bedrest Code Status: Full Family Communication: Cousin Olegario Messier) updated via phone 2/24. She reports his parents are not able to hear that he may not recover. They are struggling with his current situation. She feels it is best that we wait until we have MRI results before talking to the parents.  Disposition: ICU    CC Time: 35 minutes   Canary Brim, MSN, NP-C [facility] 12/23/2019, 10:39 AM Please see [email or web address] for pager details.    Attending Note:  I have examined patient, reviewed labs, studies and notes.   54 year old man with history of suspected asthma, found down after an apparent PEA  arrest, presumed cardiopulmonary (he had a bronchodilator inhaler in his hand).  He required 8 minutes of CPR before ROSC.  Course has unfortunately been characterized by profound encephalopathy, myoclonus consistent with a poor neurological prognosis.  Vitals:   12/23/19 1005 12/23/19 1100 12/23/19 1120 12/23/19 1200  BP: (!) 159/86 (!) 132/112  (!) 155/94  Pulse: (!) 109 (!) 108 (!) 115 (!) 110  Resp: (!) 22 (!) 22 (!) 22 (!) 22  Temp:   98.7 F (37.1 C)   TempSrc:   Oral   SpO2: 98% 96% 97% 97%  Weight:      Height:      Ill-appearing man, comatose on no continuous sedation.  Ventilated, intubated with ET tube in good position.  He is showing persistent right upper extremity greater than left upper extremity myoclonus.  Lungs are coarse bilaterally.  Heart is regular without a murmur.  Abdomen is nondistended with positive bowel sounds.  No significant lower extremity edema.  Acute hypoxic encephalopathy post cardiac arrest and CPR.  Myoclonus and neurologic exam consistent with a severe injury and poor prognosis for meaningful neurological recovery.  His myoclonus has responded to benzodiazepines which are being used as needed.  EEG has shown no overt seizures, consistent with a severe encephalopathy.  MRI brain is ordered and is pending.  We will consult with neurology after the MRI is interpreted, plan to discuss prognosis with the patient's parents.  Acute respiratory failure due to the above.  Plan to continue his current ventilator support.  Clearly neurological status precludes extubation.  He is being treated for a possible AE asthma with corticosteroids.  Continue his current bronchodilator regimen.  Hyperglycemia.  Currently on sliding scale insulin per protocol  Independent critical care time is 31 minutes.   Levy Pupa, MD, PhD 12/23/2019, 1:05 PM [facility] [phone] or if no answer [phone]

## 2019-12-23 NOTE — Progress Notes (Signed)
Neurology Progress Note   S:// Seen and examined Remains off sedation   O:// Current vital signs: BP (!) 167/90   Pulse 96   Temp 98.6 F (37 C) (Oral)   Resp (!) 22   Ht 5' 7"  (1.702 m)   Wt 71.3 kg   SpO2 97%   BMI 24.62 kg/m  Vital signs in last 24 hours: Temp:  [98.2 F (36.8 C)-99 F (37.2 C)] 98.6 F (37 C) (02/24 0734) Pulse Rate:  [65-105] 96 (02/24 0602) Resp:  [16-22] 22 (02/24 0602) BP: (121-176)/(70-120) 167/90 (02/24 0602) SpO2:  [96 %-100 %] 97 % (02/24 0602) FiO2 (%):  [40 %] 40 % (02/24 0339) Weight:  [71.3 kg] 71.3 kg (02/24 0413) Gen: no sedation, intubated HEENT: [place] AT CVS: RRR Res: vented Ext: warm well perfused NEUROLOGICAL Intubated, not sedated Breathing over the vent Corneals and cough/gag present CN: PERRL, +oculocephalics, face grossly symmetric Motor: no spontaneous movement, no withdrawal to nox stim - on nox stim started shaking all over for about a minute (hooked up to EEG - will review the EEG to see if that was seizure). Sensory- as above  Medications  Current Facility-Administered Medications:  .  0.9 %  sodium chloride infusion, , Intravenous, Continuous, Candee Furbish, MD, Last Rate: 10 mL/hr at 12/23/19 0600, Rate Verify at 12/23/19 0600 .  acetaminophen (TYLENOL) 160 MG/5ML solution 650 mg, 650 mg, Per Tube, Q4H PRN, Sampson Goon, MD .  amLODipine (NORVASC) tablet 5 mg, 5 mg, Oral, Daily, Candee Furbish, MD, 5 mg at 12/22/19 0901 .  bisacodyl (DULCOLAX) suppository 10 mg, 10 mg, Rectal, Daily PRN, Bowser, Laurel Dimmer, NP .  chlorhexidine gluconate (MEDLINE KIT) (PERIDEX) 0.12 % solution 15 mL, 15 mL, Mouth Rinse, BID, Sampson Goon, MD, 15 mL at 12/22/19 2020 .  Chlorhexidine Gluconate Cloth 2 % PADS 6 each, 6 each, Topical, Daily, Candee Furbish, MD, 6 each at 12/22/19 2249 .  docusate (COLACE) 50 MG/5ML liquid 100 mg, 100 mg, Per Tube, BID PRN, Bowser, Laurel Dimmer, NP .  feeding supplement (VITAL AF 1.2 CAL) liquid 1,000 mL,  1,000 mL, Per Tube, Continuous, Candee Furbish, MD, Last Rate: 65 mL/hr at 12/22/19 2200, 1,000 mL at 12/22/19 2200 .  fentaNYL (SUBLIMAZE) injection 50 mcg, 50 mcg, Intravenous, Q15 min PRN, Bowser, Laurel Dimmer, NP, 50 mcg at 12/22/19 1151 .  fentaNYL (SUBLIMAZE) injection 50-200 mcg, 50-200 mcg, Intravenous, Q30 min PRN, Bowser, Laurel Dimmer, NP, 50 mcg at 12/22/19 2219 .  heparin injection 5,000 Units, 5,000 Units, Subcutaneous, Q8H, Stretch, Marily Lente, MD, 5,000 Units at 12/23/19 0602 .  insulin aspart (novoLOG) injection 0-9 Units, 0-9 Units, Subcutaneous, Q4H, Candee Furbish, MD, 1 Units at 12/23/19 0410 .  levETIRAcetam (KEPPRA) IVPB 1000 mg/100 mL premix, 1,000 mg, Intravenous, Q12H, Amie Portland, MD, Stopped at 12/23/19 0025 .  MEDLINE mouth rinse, 15 mL, Mouth Rinse, 10 times per day, Sampson Goon, MD, 15 mL at 12/23/19 0602 .  [COMPLETED] methylPREDNISolone sodium succinate (SOLU-MEDROL) 125 mg/2 mL injection 60 mg, 60 mg, Intravenous, Q12H, 60 mg at 12/21/19 2214 **FOLLOWED BY** methylPREDNISolone sodium succinate (SOLU-MEDROL) 125 mg/2 mL injection 60 mg, 60 mg, Intravenous, Daily, Bowser, Grace E, NP, 60 mg at 12/22/19 0900 .  midazolam (VERSED) injection 2 mg, 2 mg, Intravenous, Q15 min PRN, Bowser, Grace E, NP .  midazolam (VERSED) injection 2 mg, 2 mg, Intravenous, Q2H PRN, Bowser, Grace E, NP .  ondansetron (ZOFRAN) injection 4 mg,  4 mg, Intravenous, Q6H PRN, Bowser, Grace E, NP .  pantoprazole (PROTONIX) injection 40 mg, 40 mg, Intravenous, QHS, Bowser, Grace E, NP, 40 mg at 12/22/19 2219 .  propofol (DIPRIVAN) 1000 MG/100ML infusion, 5-80 mcg/kg/min, Intravenous, Continuous, Sampson Goon, MD, Stopped at 12/21/19 0840 .  scopolamine (TRANSDERM-SCOP) 1 MG/3DAYS 1.5 mg, 1 patch, Transdermal, Q72H, Candee Furbish, MD, 1.5 mg at 12/21/19 1257 Labs CBC    Component Value Date/Time   WBC 17.9 (H) 12/23/2019 0312   RBC 4.13 (L) 12/23/2019 0312   HGB 12.3 (L) 12/23/2019 0312   HCT 36.5  (L) 12/23/2019 0312   PLT 262 12/23/2019 0312   MCV 88.4 12/23/2019 0312   MCH 29.8 12/23/2019 0312   MCHC 33.7 12/23/2019 0312   RDW 13.6 12/23/2019 0312    CMP     Component Value Date/Time   NA 141 12/23/2019 0312   K 3.8 12/23/2019 0312   CL 106 12/23/2019 0312   CO2 26 12/23/2019 0312   GLUCOSE 152 (H) 12/23/2019 0312   BUN 24 (H) 12/23/2019 0312   CREATININE 0.94 12/23/2019 0312   CALCIUM 8.4 (L) 12/23/2019 0312   PROT 6.0 (L) 12/21/2019 1712   ALBUMIN 3.4 (L) 12/06/2019 1712   AST 37 12/17/2019 1712   ALT 24 12/04/2019 1712   ALKPHOS 65 12/26/2019 1712   BILITOT 0.8 12/25/2019 1712   GFRNONAA >60 12/23/2019 0312   GFRAA >60 12/23/2019 2409   Imaging I have reviewed images in epic and the results pertinent to this consultation are: CT-scan of the brain on arrival with no acute changes  LTM EEG with some fast runs concerning for ictal activity yesterday. Pending result from last 24h  Assessment:  56/M post cardiac arrest, with ~71mn downtime, concern for myoclonic jerking and extensor posturing on initial presenation. EEG with question of possible ictal activity after d/c of sedation. Suspect HIE/anoxic brain injury. Exam has present brainstem reflexes but unable to elicit higher cortical responses. Small duration of possible following commands in the b/l LE on Monday has not happened since then and I wonder if propofol at the time was helping with underlying ictal activity and discontinuing it might have resulted in intermittent ictal events and interceding post ictal phase. Alternatively - could have sustained global anoxic injury. Will need imaging to further assess.  Impression: -HIE/Anoxic brain injury -Possible seizures  Recommendations: Await LTM read If negative for ictal activity - will d/c LTM and get MRI Continue with Keppra 1g BID Supportive care per PCCM Will follow  -- AAmie Portland MD [facility] Pager: [phone] 7pm to  7am, please call on call as listed on AMION.  CRITICAL CARE ATTESTATION Performed by: AAmie Portland MD Total critical care time: 31 minutes Critical care time was exclusive of separately billable procedures and treating other patients and/or supervising APPs/Residents/Students Critical care was necessary to treat or prevent imminent or life-threatening deterioration due to HIE/Anoxic brain injury, possible seizures. This patient is critically ill and at significant risk for neurological worsening and/or death and care requires constant monitoring. Critical care was time spent personally by me on the following activities: development of treatment plan with patient and/or surrogate as well as nursing, discussions with consultants, evaluation of patient's response to treatment, examination of patient, obtaining history from patient or surrogate, ordering and performing treatments and interventions, ordering and review of laboratory studies, ordering and review of radiographic studies, pulse oximetry, re-evaluation of patient's condition, participation in multidisciplinary rounds and medical decision making of  high complexity in the care of this patient.

## 2019-12-23 NOTE — Progress Notes (Signed)
EEg maint complete. Skin breakdown noted at electrode site FP2. Moved electrode. No other skin breakdown noted. Continue to monitor

## 2019-12-23 NOTE — Procedures (Addendum)
Patient Name:Nathaniel Casey MPN:[id number] Epilepsy Attending:Keanan Melander Annabelle Harman Referring Physician/Provider:Dr Ritta Slot Duration:12/22/2019 0086 to 02/24/20211034  Patient history:54 year old male with likely diffuse anoxic injury,overnighthad movementsmyoclonus versus extensor posturing concerning forseizure. EEG to evaluate for seizure.  Level of alertness:awake/sedated   AEDs during EEG study: LEV  Technical aspects: This EEG study was done with scalp electrodes positioned according to the 10-20 International system of electrode placement. Electrical activity was acquired at a sampling rate of 500Hz  and reviewed with a high frequency filter of 70Hz  and a low frequency filter of 1Hz . EEG data were recorded continuously and digitally stored.  DESCRIPTION: EEG showed continuous generalized low amplitude 13-15hz  beta activity admixed with 6-9hz  theta-alpha activity. At around 1323 on 12/22/2019, patient was noted to have subtle left eyelid twitching without concomitant EEG change before during and after the event. After around 2am on 12/23/2019, multiple episodes of whole body twitching were recorded without concomitant EEG change before during and after the event.  Some of these events were stimulation induced, while others were not.  ABNORMALITY -Beta activity, generalized  IMPRESSION: This study issuggestive of severe diffuse encephalopathy, non specific to etiology but could be secondary to hypoxic/anoxic injury.One episode of subtle left eye twitching was noted on 12/22/2019 at 1323 without concomitant EEG change as well as multiple episodes of whole body twitching were recorded without concomitant EEG change.  Semiology of these episodes is concerning for subcortical myoclonus.   EEG was discussed with Dr. 12/24/2019.          Nathaniel Casey 12/25/2019

## 2019-12-23 NOTE — Progress Notes (Signed)
LTM EEG discontinued - no skin breakdown at unhook.   

## 2019-12-24 LAB — GLUCOSE, CAPILLARY
Glucose-Capillary: 117 mg/dL — ABNORMAL HIGH (ref 70–99)
Glucose-Capillary: 120 mg/dL — ABNORMAL HIGH (ref 70–99)
Glucose-Capillary: 137 mg/dL — ABNORMAL HIGH (ref 70–99)
Glucose-Capillary: 144 mg/dL — ABNORMAL HIGH (ref 70–99)
Glucose-Capillary: 145 mg/dL — ABNORMAL HIGH (ref 70–99)
Glucose-Capillary: 162 mg/dL — ABNORMAL HIGH (ref 70–99)

## 2019-12-24 LAB — CBC
HCT: 38.3 % — ABNORMAL LOW (ref 39.0–52.0)
Hemoglobin: 12.7 g/dL — ABNORMAL LOW (ref 13.0–17.0)
MCH: 29.5 pg (ref 26.0–34.0)
MCHC: 33.2 g/dL (ref 30.0–36.0)
MCV: 89.1 fL (ref 80.0–100.0)
Platelets: 289 10*3/uL (ref 150–400)
RBC: 4.3 MIL/uL (ref 4.22–5.81)
RDW: 13.6 % (ref 11.5–15.5)
WBC: 15.3 10*3/uL — ABNORMAL HIGH (ref 4.0–10.5)
nRBC: 0.1 % (ref 0.0–0.2)

## 2019-12-24 LAB — BASIC METABOLIC PANEL
Anion gap: 9 (ref 5–15)
BUN: 30 mg/dL — ABNORMAL HIGH (ref 6–20)
CO2: 26 mmol/L (ref 22–32)
Calcium: 8.7 mg/dL — ABNORMAL LOW (ref 8.9–10.3)
Chloride: 109 mmol/L (ref 98–111)
Creatinine, Ser: 0.94 mg/dL (ref 0.61–1.24)
GFR calc Af Amer: >60 mL/min (ref >60)
GFR calc non Af Amer: >60 mL/min (ref >60)
Glucose, Bld: 146 mg/dL — ABNORMAL HIGH (ref 70–99)
Potassium: 3.7 mmol/L (ref 3.5–5.1)
Sodium: 144 mmol/L (ref 135–145)

## 2019-12-24 LAB — CULTURE, BLOOD (ROUTINE X 2)
Culture: NO GROWTH
Culture: NO GROWTH

## 2019-12-24 LAB — TRIGLYCERIDES: Triglycerides: 168 mg/dL — ABNORMAL HIGH (ref <150)

## 2019-12-24 MED ORDER — PROPOFOL 1000 MG/100ML IV EMUL
5.0000 ug/kg/min | INTRAVENOUS | Status: DC
  Administered 2019-12-24: 30 ug/kg/min via INTRAVENOUS
  Administered 2019-12-24: 50 ug/kg/min via INTRAVENOUS
  Administered 2019-12-24: 20 ug/kg/min via INTRAVENOUS
  Administered 2019-12-24: 50 ug/kg/min via INTRAVENOUS
  Administered 2019-12-24 – 2019-12-25 (×2): 40 ug/kg/min via INTRAVENOUS
  Administered 2019-12-25: 50 ug/kg/min via INTRAVENOUS
  Administered 2019-12-25: 30 ug/kg/min via INTRAVENOUS
  Administered 2019-12-25: 50 ug/kg/min via INTRAVENOUS
  Administered 2019-12-26 – 2019-12-28 (×11): 30 ug/kg/min via INTRAVENOUS
  Administered 2019-12-29 (×2): 80 ug/kg/min via INTRAVENOUS
  Administered 2019-12-29 (×2): 30 ug/kg/min via INTRAVENOUS
  Filled 2019-12-24 (×13): qty 100
  Filled 2019-12-24: qty 200
  Filled 2019-12-24 (×10): qty 100

## 2019-12-24 NOTE — Progress Notes (Signed)
Assisted tele visit to patient with family member.  Chaela Branscum R, RN  

## 2019-12-24 NOTE — Progress Notes (Signed)
NAME:  Nathaniel Casey, MRN:  [id number], DOB:  1966-05-31, LOS: 5 ADMISSION DATE:  12/01/2019, CONSULTATION DATE:  REFERRING MD:  ED, CHIEF COMPLAINT:  Cardiac arrest   Brief History   54 y/o M with no known medical history admitted 2/20 after calling EMS for SOB.  Found down with inhaler in hand / PEA Arrest.  Required 8 minutes CPR prior to ROSC. Concern for myoclonus on admit.   Past Medical History  Asthma - noncompliant with regimen per cousin  Never [facility] [facility] Events   2/20 Admit post PEA arrest  2/21 Ongoing rhythmic jerking, increasing when propofol paused. Propofol resumed.  2/22 Propofol off, followed some commands for neurology 2/24 Not following commands, remains off sedation.  EEG with severe encephalopathy    Consults:  Neurology   Procedures:  ETT 2/20 >>  Significant Diagnostic Tests:   Head CT 2/20 > no acute intracranial abnormality   ECHO 2/21 >> LVEF 45-50%, LV regional wall motion abnormality, mild LVH, RV systolic function normal  EEG 2/24 >> severe diffuse encephalopathy, non-specific to etiology.  Could be anoxic injury. Episodes of twitching on 2/23 without concomitant EEG changes   MRI Brain 2/24 >> widespread cerebral and cerebellar edema consistent with hypoxic-ischemic injury. No herniation or hemorrhage  Micro Data:  Influenza A/B 2/20 >> negative  COVID 2/20 >> negative  MRSA PCR 2/21 >> negative  RVP 2/20 >> negative  BCx2 2/20 >>   Antimicrobials:  Azithromycin 2/20 x1 Rocephin 2/20 x1   Interim history/subjective:  RN reports episode of significant tachycardia / myoclonic episodes overnight  Afebrile  Remains on vent Glucose range 114-146 I/O - 2.1L UOP, -359ml for 24h, remains positive 8.8L overall  Objective   Blood pressure 128/76, pulse 100, temperature 98.4 F (36.9 C), temperature source Oral, resp. rate (!) 22, height 5\' 7"  (1.702 m), weight 71.3 kg, SpO2 100 %.    Vent Mode: PRVC FiO2 (%):  [40  %] 40 % Set Rate:  [22 bmp] 22 bmp Vt Set:  [520 mL] 520 mL PEEP:  [5 cmH20] 5 cmH20 Plateau Pressure:  [17 cmH20-19 cmH20] 19 cmH20   Intake/Output Summary (Last 24 hours) at 12/24/2019 0835 Last data filed at 12/24/2019 0800 Gross per 24 hour  Intake 1712.52 ml  Output 2100 ml  Net -387.48 ml   Filed Weights   12/21/19 0432 12/22/19 0349 12/23/19 0413  Weight: 68.5 kg 69.6 kg 71.3 kg    Examination: General: adult male lying in bed in NAD on vent  HEENT: MM pink/moist, ETT in place Neuro: sedate on propofol, sluggish pupils but reactive, weak cough/gag, no spontaneous movements  CV: s1s2 rrr, SR/ST on monitor, no m/r/g PULM: non-labored on vent, lungs bilaterally clear  GI: soft, bsx4 active  Extremities: warm/dry, no edema  Skin: no rashes or lesions  Resolved Hospital Problem list   AKI  Hyopkalemia Electrolyte Disturbances   Assessment & Plan:   PEA Cardiac Arrest  Thought to be respiratory in origin given SOB complaints (reported hx of asthma, non-compliant).  Unknown downtime, required 8 minutes CPR prior to ROSC. ECHO benign.    -monitor in ICU    Acute Hypoxic Encephalopathy  Suspected Myoclonus  MRI brain consistent with severe anoxic injury -appreciate Neurology assistance with patient care -plan [facility] -follow serial neuro exams -propofol for comfort in setting of severe myoclonus  -continue keppra -poor prognosis for meaningful recovery   Acute Respiratory Failure secondary to Cardiac  Arrest  -trach / PEG would not PRVC 8cc/kg  -wean PEEP / FiO2 for sats > 90% -follow intermittent CXR     Suspected Asthma Flare  -continue solumedrol  -brovana + pulmicort   Hyperglycemia  -SSI, sensitive scale   At Risk Malnutrition -TF per Nutrition    At Risk Electrolyte Disturbances  -follow BMP    Labs   Recent Labs  Lab 01/03/2020 1712 01-03-2020 1907 12/20/19 0148 12/20/19 0410 12/20/19 0263 12/20/19 7858 12/20/19 1209  12/20/19 1617 12/20/19 2013 12/21/19 8502 12/21/19 0433 12/21/19 1645 12/22/19 0112 12/22/19 0112 12/23/19 0312 12/24/19 0233  NA   < >  --  140   < > 138  --   --   --   --  139  --   --  137  --  141 144  K   < >  --  3.5   < > 3.6   < >  --   --   --  4.6   < >  --  4.3   < > 3.8 3.7  CL   < >  --  106  --   --   --   --   --   --  108  --   --  104  --  106 109  CO2   < >  --  21*  --   --   --   --   --   --  21*  --   --  22  --  26 26  GLUCOSE   < >  --  184*  --   --   --   --   --   --  152*  --   --  168*  --  152* 146*  BUN   < >  --  12  --   --   --   --   --   --  18  --   --  24*  --  24* 30*  CREATININE   < >  --  1.16  --   --   --   --   --   --  1.07  --   --  0.91  --  0.94 0.94  CALCIUM   < >  --  7.5*  --   --   --   --   --   --  8.2*  --   --  8.4*  --  8.4* 8.7*  MG  --  2.3 1.5*  --   --   --   --   --  2.9* 2.5*  --   --   --   --   --   --   PHOS   < > 5.2* 1.2*  --   --   --  3.6 2.6  --  3.0  --  3.1  --   --   --   --    < > = values in this interval not displayed.   Recent Labs  Lab 12/22/19 0112 12/23/19 0312 12/24/19 0233  HGB 13.3 12.3* 12.7*  HCT 40.3 36.5* 38.3*  WBC 21.6* 17.9* 15.3*  PLT 270 262 289     Best practice:  Diet: TF Pain/Anxiety/Delirium protocol (if indicated): PRN fentanyl low dose, propofol gtt VAP protocol (if indicated): Ordered DVT prophylaxis: SCD SQH GI prophylaxis: Protonix Glucose control: SSI Mobility: Bedrest Code Status: Full Family Communication: Cousin Olegario Messier) updated  via phone 2/25. Mother called to arrange family meeting with Neurology.  Will plan for 2PM.   Disposition: ICU    CC Time: 34 minutes   Canary Brim, MSN, NP-C [facility] 12/24/2019, 8:35 AM Please see [email or web address] for pager details.

## 2019-12-24 NOTE — Progress Notes (Signed)
eLink Physician-Brief Progress Note Patient Name: Nathaniel Casey DOB: 1966/03/13 MRN: [id number]   Date of Service  12/24/2019  HPI/Events of Note  Tremors, tachycardia and hypertension. EEG suggests subcortical myoclonus. Neuro Storm?  eICU Interventions  Will order: 1. Resume Propofol IV infusion. Titrate to RASS = 0 to -1.      Intervention Category Major Interventions: Hypertension - evaluation and management;Arrhythmia - evaluation and management  Denee Boeder Eugene 12/24/2019, 4:09 AM

## 2019-12-24 NOTE — Plan of Care (Signed)
Family meeting held. With Dr Delton Coombes, patient's parents and two brothers. Answered all questions  -- Milon Dikes, MD [facility] Pager: [phone] If 7pm to 7am, please call on call as listed on AMION.

## 2019-12-24 NOTE — Progress Notes (Signed)
Neurology Progress Note   S:// Seen and examined. MRI brain completed - anoxic brain injury findings. EEG with subcortical myoclonus. O/N needed sedation for tremors, tachycardia and HTN and propofol was resumed after being off for it for >48hrs  O:// Current vital signs: BP 135/82   Pulse (!) 121   Temp 98.4 F (36.9 C) (Axillary)   Resp (!) 22   Ht _0  (1.702 m)   Wt 71.3 kg   SpO2 100%   BMI 24.62 kg/m  Vital signs in last 24 hours: Temp:  [98.2 F (36.8 C)-99.6 F (37.6 C)] 98.4 F (36.9 C) (02/25 0400) Pulse Rate:  [102-143] 121 (02/25 0600) Resp:  [19-31] 22 (02/25 0600) BP: (125-252)/(72-199) 135/82 (02/25 0600) SpO2:  [95 %-100 %] 100 % (02/25 0600) FiO2 (%):  [40 %] 40 % (02/25 0354) General: Sedated and intubated HEENT: Normocephalic atraumatic Cardiovascular: Mildly tachycardic and hypertensive Lungs: Vented Neurological exam Sedated on propofol and intubated Propofol was reduced for examination Pupils are sluggishly reactive, equal and round. Corneals present bilaterally Weak cough and gag present-likely due to sedation. No spontaneous movement Breathing over the ventilator when not sedated On noxious stimulation, no movement in the upper extremities.  Minimal triple flexion in the lower extremities bilaterally.   Medications  Current Facility-Administered Medications:  .  0.9 %  sodium chloride infusion, , Intravenous, Continuous, Candee Furbish, MD, Stopped at 12/23/19 1641 .  acetaminophen (TYLENOL) 160 MG/5ML solution 650 mg, 650 mg, Per Tube, Q4H PRN, Sampson Goon, MD, 650 mg at 12/23/19 1528 .  amLODipine (NORVASC) tablet 5 mg, 5 mg, Oral, Daily, Candee Furbish, MD, 5 mg at 12/23/19 0957 .  arformoterol (BROVANA) nebulizer solution 15 mcg, 15 mcg, Nebulization, BID, Ollis, Brandi L, NP, 15 mcg at 12/23/19 2040 .  bisacodyl (DULCOLAX) suppository 10 mg, 10 mg, Rectal, Daily PRN, Bowser, Grace E, NP .  budesonide (PULMICORT) nebulizer  solution 0.5 mg, 0.5 mg, Nebulization, BID, Ollis, Brandi L, NP, 0.5 mg at 12/23/19 2040 .  chlorhexidine gluconate (MEDLINE KIT) (PERIDEX) 0.12 % solution 15 mL, 15 mL, Mouth Rinse, BID, Sampson Goon, MD, 15 mL at 12/23/19 2030 .  Chlorhexidine Gluconate Cloth 2 % PADS 6 each, 6 each, Topical, Daily, Candee Furbish, MD, 6 each at 12/22/19 2249 .  docusate (COLACE) 50 MG/5ML liquid 100 mg, 100 mg, Per Tube, BID PRN, Bowser, Laurel Dimmer, NP .  feeding supplement (VITAL AF 1.2 CAL) liquid 1,000 mL, 1,000 mL, Per Tube, Continuous, Candee Furbish, MD, Last Rate: 65 mL/hr at 12/24/19 0000, Rate Verify at 12/24/19 0000 .  fentaNYL (SUBLIMAZE) injection 50-200 mcg, 50-200 mcg, Intravenous, Q30 min PRN, Bowser, Grace E, NP, 100 mcg at 12/24/19 0307 .  heparin injection 5,000 Units, 5,000 Units, Subcutaneous, Q8H, Stretch, Marily Lente, MD, 5,000 Units at 12/24/19 0516 .  insulin aspart (novoLOG) injection 0-9 Units, 0-9 Units, Subcutaneous, Q4H, Candee Furbish, MD, 1 Units at 12/23/19 2030 .  levETIRAcetam (KEPPRA) IVPB 1000 mg/100 mL premix, 1,000 mg, Intravenous, Q12H, Amie Portland, MD, Last Rate: 400 mL/hr at 12/24/19 0000, 1,000 mg at 12/24/19 0000 .  MEDLINE mouth rinse, 15 mL, Mouth Rinse, 10 times per day, Sampson Goon, MD, 15 mL at 12/24/19 0339 .  [COMPLETED] methylPREDNISolone sodium succinate (SOLU-MEDROL) 125 mg/2 mL injection 60 mg, 60 mg, Intravenous, Q12H, 60 mg at 12/21/19 2214 **FOLLOWED BY** methylPREDNISolone sodium succinate (SOLU-MEDROL) 125 mg/2 mL injection 60 mg, 60 mg, Intravenous, Daily, Bowser, Laurel Dimmer, NP,  60 mg at 12/23/19 0957 .  midazolam (VERSED) injection 2 mg, 2 mg, Intravenous, Q2H PRN, Bowser, Laurel Dimmer, NP, 2 mg at 12/24/19 0405 .  ondansetron (ZOFRAN) injection 4 mg, 4 mg, Intravenous, Q6H PRN, Bowser, Grace E, NP .  pantoprazole (PROTONIX) injection 40 mg, 40 mg, Intravenous, QHS, Bowser, Grace E, NP, 40 mg at 12/23/19 2250 .  propofol (DIPRIVAN) 1000 MG/100ML infusion,  5-80 mcg/kg/min, Intravenous, Titrated, Anders Simmonds, MD, Last Rate: 25.7 mL/hr at 12/24/19 0510, 60 mcg/kg/min at 12/24/19 0510 .  scopolamine (TRANSDERM-SCOP) 1 MG/3DAYS 1.5 mg, 1 patch, Transdermal, Q72H, Candee Furbish, MD, 1.5 mg at 12/21/19 1257 Labs CBC    Component Value Date/Time   WBC 15.3 (H) 12/24/2019 0233   RBC 4.30 12/24/2019 0233   HGB 12.7 (L) 12/24/2019 0233   HCT 38.3 (L) 12/24/2019 0233   PLT 289 12/24/2019 0233   MCV 89.1 12/24/2019 0233   MCH 29.5 12/24/2019 0233   MCHC 33.2 12/24/2019 0233   RDW 13.6 12/24/2019 0233    CMP     Component Value Date/Time   NA 144 12/24/2019 0233   K 3.7 12/24/2019 0233   CL 109 12/24/2019 0233   CO2 26 12/24/2019 0233   GLUCOSE 146 (H) 12/24/2019 0233   BUN 30 (H) 12/24/2019 0233   CREATININE 0.94 12/24/2019 0233   CALCIUM 8.7 (L) 12/24/2019 0233   PROT 6.0 (L) 12/21/2019 1712   ALBUMIN 3.4 (L) 12/08/2019 1712   AST 37 12/17/2019 1712   ALT 24 12/02/2019 1712   ALKPHOS 65 12/23/2019 1712   BILITOT 0.8 12/17/2019 1712   GFRNONAA >60 12/24/2019 0233   GFRAA >60 12/24/2019 0233    Imaging I have reviewed images in epic and the results of imaging studies: MRI brain consistent with global anoxic brain injury-symmetric T2/FLAIR hyperintensities throughout the cortex and bilateral cerebellar hemispheres.  No midline shift or tonsillar herniation.  No bleed.  Overnight EEG yesterday-consistent with diffuse encephalopathy nonspecific.  Episodic subcortical myoclonus observed.  Assessment: 54 year old status post cardiac arrest with prolonged downtime, with imaging study suggestive of global anoxic brain injury and electrographic studies revealing supportive of myoclonus. Exam has remained poor over the past 3 days-even when he was off of sedation for upwards of 48 hours. Reporting the history, clinical exam, imaging and radiographic findings together, the chances of neurologically meaningful recovery are extremely  poor.  Impression: Severe anoxic brain injury  Recommendations: -The constellation of findings portends grim chances of neurologically meaningful recovery. -I would recommend having a family meeting to discuss goals of care. -Continue Keppra for tremor/subcortical myoclonic activity. -Continue sedation as needed for vent management  I will be available for the family meeting as needed.  Plan discussed with Noe Gens, NP, PCCM on the unit  -- Amie Portland, MD [facility] Pager: [phone] If 7pm to 7am, please call on call as listed on AMION.  CRITICAL CARE ATTESTATION Performed by: Amie Portland, MD Total critical care time: 40 minutes Critical care time was exclusive of separately billable procedures and treating other patients and/or supervising APPs/Residents/Students Critical care was necessary to treat or prevent imminent or life-threatening deterioration due to severe anoxic brain injury  This patient is critically ill and at significant risk for neurological worsening and/or death and care requires constant monitoring. Critical care was time spent personally by me on the following activities: development of treatment plan with patient and/or surrogate as well as nursing, discussions with consultants, evaluation of patient's response to treatment, examination  of patient, obtaining history from patient or surrogate, ordering and performing treatments and interventions, ordering and review of laboratory studies, ordering and review of radiographic studies, pulse oximetry, re-evaluation of patient's condition, participation in multidisciplinary rounds and medical decision making of high complexity in the care of this patient.

## 2019-12-24 NOTE — Progress Notes (Signed)
PCCM Family Meeting Note  I met with the patient's parents, 2 brothers along with Dr. Rory Percy with neurology and the patient's RN Vaughan Basta.  Explained the events leading up to patient's hospitalization, his support, his clinical status especially his current neurological status and injuries.  Reviewed his prognosis which is poor for any meaningful recovery.  I have recommended that we consider a transition to comfort based approach.  All questions answered.  The family is processing this information.  We will discuss again tomorrow.  If the family is prepared to withdraw the now will help facilitate.  Independent critical care time 30 minutes  Baltazar Apo, MD, PhD 12/24/2019, 3:10 PM [facility] [phone] or if no answer [phone]

## 2019-12-24 NOTE — Care Management (Signed)
CM received VM from [employer] and [employer] CM Boneta Lucks.  [phone]  CM spoke with pts next of kin Mom and confirmed that CM could communicate directly with Carla from [employer].    Albin Felling will send pt's mom disability release form to her email and will follow up with CM with requests regarding status updates.

## 2019-12-25 LAB — BASIC METABOLIC PANEL
Anion gap: 12 (ref 5–15)
BUN: 30 mg/dL — ABNORMAL HIGH (ref 6–20)
CO2: 23 mmol/L (ref 22–32)
Calcium: 8.4 mg/dL — ABNORMAL LOW (ref 8.9–10.3)
Chloride: 104 mmol/L (ref 98–111)
Creatinine, Ser: 0.8 mg/dL (ref 0.61–1.24)
GFR calc Af Amer: >60 mL/min (ref >60)
GFR calc non Af Amer: >60 mL/min (ref >60)
Glucose, Bld: 138 mg/dL — ABNORMAL HIGH (ref 70–99)
Potassium: 2.8 mmol/L — ABNORMAL LOW (ref 3.5–5.1)
Sodium: 139 mmol/L (ref 135–145)

## 2019-12-25 LAB — GLUCOSE, CAPILLARY
Glucose-Capillary: 103 mg/dL — ABNORMAL HIGH (ref 70–99)
Glucose-Capillary: 123 mg/dL — ABNORMAL HIGH (ref 70–99)
Glucose-Capillary: 126 mg/dL — ABNORMAL HIGH (ref 70–99)
Glucose-Capillary: 135 mg/dL — ABNORMAL HIGH (ref 70–99)
Glucose-Capillary: 145 mg/dL — ABNORMAL HIGH (ref 70–99)
Glucose-Capillary: 156 mg/dL — ABNORMAL HIGH (ref 70–99)

## 2019-12-25 LAB — CBC
HCT: 36.1 % — ABNORMAL LOW (ref 39.0–52.0)
Hemoglobin: 12.3 g/dL — ABNORMAL LOW (ref 13.0–17.0)
MCH: 29.7 pg (ref 26.0–34.0)
MCHC: 34.1 g/dL (ref 30.0–36.0)
MCV: 87.2 fL (ref 80.0–100.0)
Platelets: 238 10*3/uL (ref 150–400)
RBC: 4.14 MIL/uL — ABNORMAL LOW (ref 4.22–5.81)
RDW: 13.2 % (ref 11.5–15.5)
WBC: 14.9 10*3/uL — ABNORMAL HIGH (ref 4.0–10.5)
nRBC: 0 % (ref 0.0–0.2)

## 2019-12-25 MED ORDER — POTASSIUM CHLORIDE 20 MEQ/15ML (10%) PO SOLN
40.0000 meq | Freq: Two times a day (BID) | ORAL | Status: AC
  Administered 2019-12-25 (×2): 40 meq
  Filled 2019-12-25 (×2): qty 30

## 2019-12-25 NOTE — Care Management (Signed)
CM received medical release form from [employer] signed by pts mom.   CM faxed H&P as requested to   [phone].

## 2019-12-25 NOTE — Progress Notes (Addendum)
NAME:  Nathaniel Casey, MRN:  [id number], DOB:  1966/09/04, LOS: 6 ADMISSION DATE:  2020/01/11, CONSULTATION DATE:  REFERRING MD:  ED, CHIEF COMPLAINT:  Cardiac arrest   Brief History   54 y/o M with no known medical history admitted 2/20 after calling EMS for SOB.  Found down with inhaler in hand / PEA Arrest.  Required 8 minutes CPR prior to ROSC. Concern for myoclonus on admit.   Past Medical History  Asthma - noncompliant with regimen per cousin  Never Smoker   Significant Hospital Events   2/20 Admit post PEA arrest  2/21 Ongoing rhythmic jerking, increasing when propofol paused. Propofol resumed.  2/22 Propofol off, followed some commands for neurology 2/24 Not following commands, remains off sedation.  EEG with severe encephalopathy 2/25 Family meeting, recommendations for comfort focused care  Consults:  Neurology   Procedures:  ETT 2/20 >>  Significant Diagnostic Tests:   Head CT 2/20 > no acute intracranial abnormality   ECHO 2/21 >> LVEF 45-50%, LV regional wall motion abnormality, mild LVH, RV systolic function normal  EEG 6/56 >> severe diffuse encephalopathy, non-specific to etiology.  Could be anoxic injury. Episodes of twitching on 2/23 without concomitant EEG changes   MRI Brain 2/24 >> widespread cerebral and cerebellar edema consistent with hypoxic-ischemic injury. No herniation or hemorrhage  Micro Data:  Influenza A/B 2/20 >> negative  COVID 2/20 >> negative  MRSA PCR 2/21 >> negative  RVP 2/20 >> negative  BCx2 2/20 >>   Antimicrobials:  Azithromycin 2/20 x1 Rocephin 2/20 x1   Interim history/subjective:  RN reports no acute events overnight. Remains on propofol Afebrile  Objective   Blood pressure 121/81, pulse 83, temperature (!) 96.8 F (36 C), temperature source Axillary, resp. rate (!) 22, height 5\' 7"  (1.702 m), weight 70.9 kg, SpO2 100 %.    Vent Mode: PRVC FiO2 (%):  [40 %] 40 % Set Rate:  [22 bmp] 22 bmp Vt Set:  [520 mL] 520  mL PEEP:  [5 cmH20] 5 cmH20 Plateau Pressure:  [17 cmH20-20 cmH20] 17 cmH20   Intake/Output Summary (Last 24 hours) at 12/25/2019 12/27/2019 Last data filed at 12/25/2019 0800 Gross per 24 hour  Intake 1689.24 ml  Output 1535 ml  Net 154.24 ml   Filed Weights   12/22/19 0349 12/23/19 0413 12/25/19 0325  Weight: 69.6 kg 71.3 kg 70.9 kg    Examination: General: Critically ill-appearing adult male in no acute distress on vent HEENT: MM pink/moist, ETT Neuro: Sedate on propofol, occasional tremor noted of left hand CV: s1s2 RRR, no m/r/g PULM: Nonlabored on full support, vent assisted breath sounds bilaterally GI: soft, bsx4 active  Extremities: warm/dry, trace generalized edema  Skin: no rashes or lesions   Resolved Hospital Problem list   AKI  Hyopkalemia Electrolyte Disturbances   Assessment & Plan:   PEA Cardiac Arrest  Thought to be respiratory in origin given SOB complaints (reported hx of asthma, non-compliant).  Unknown downtime, required 8 minutes CPR prior to ROSC. ECHO benign.    -monitor in ICU    Acute Hypoxic Encephalopathy  Suspected Myoclonus  MRI brain consistent with severe anoxic injury -Continue propofol for comfort in the setting of severe myoclonus -Appreciate neurology assistance with patient care -Follow serial neuro exams -Continue Keppra -Given MRI findings concerning for poor prognosis for meaningful neurological recovery  Acute Respiratory Failure secondary to Cardiac Arrest  -PRVC 8 cc/kg -Wean PEEP/FiO2 for sats greater than 90% -Trach/PEG would not likely be in  patient's best interest -Follow intermittent chest x-ray -Continue scopolamine for secretions  Hypertension -Continue Norvasc   Suspected Asthma Flare  -Continue Brovana, Pulmicort  Hyperglycemia  -SSI, sensitive scale   At Risk Malnutrition -TF per nutrition  At Risk Electrolyte Disturbances  Hypokalemia -Trend BMP -KCl 2/26   Labs   Recent Labs  Lab 28-Dec-2019 1712  12-28-2019 1907 12/20/19 0148 12/20/19 0148 12/20/19 0410 12/20/19 1209 12/20/19 1617 12/20/19 2013 12/21/19 9937 12/21/19 1696 12/21/19 1645 12/22/19 0112 12/22/19 0112 12/23/19 0312 12/23/19 0312 12/24/19 0233 12/25/19 0655  NA   < >  --  140   < >   < >  --   --   --  139  --   --  137  --  141  --  144 139  K   < >  --  3.5   < >   < >  --   --   --  4.6   < >  --  4.3   < > 3.8   < > 3.7 2.8*  CL   < >  --  106   < >  --   --   --   --  108  --   --  104  --  106  --  109 104  CO2   < >  --  21*   < >  --   --   --   --  21*  --   --  22  --  26  --  26 23  GLUCOSE   < >  --  184*   < >  --   --   --   --  152*  --   --  168*  --  152*  --  146* 138*  BUN   < >  --  12   < >  --   --   --   --  18  --   --  24*  --  24*  --  30* 30*  CREATININE   < >  --  1.16   < >  --   --   --   --  1.07  --   --  0.91  --  0.94  --  0.94 0.80  CALCIUM   < >  --  7.5*   < >  --   --   --   --  8.2*  --   --  8.4*  --  8.4*  --  8.7* 8.4*  MG  --  2.3 1.5*  --   --   --   --  2.9* 2.5*  --   --   --   --   --   --   --   --   PHOS   < > 5.2* 1.2*  --   --  3.6 2.6  --  3.0  --  3.1  --   --   --   --   --   --    < > = values in this interval not displayed.   Recent Labs  Lab 12/23/19 0312 12/24/19 0233 12/25/19 0655  HGB 12.3* 12.7* 12.3*  HCT 36.5* 38.3* 36.1*  WBC 17.9* 15.3* 14.9*  PLT 262 289 238     Best practice:  Diet: TF Pain/Anxiety/Delirium protocol (if indicated): PRN fentanyl low dose, propofol gtt VAP protocol (if indicated): Ordered DVT prophylaxis: SCD SQH  GI prophylaxis: Protonix Glucose control: SSI Mobility: Bedrest Code Status: Full Family Communication: Family updated 2/25 in person for goals of care discussion.  We will update 2/26 on arrival Disposition: ICU    CC Time: 63 minutes  Noe Gens, MSN, NP-C [facility] 12/25/2019, 9:05 AM Please see [email or web address] for pager details.

## 2019-12-26 LAB — GLUCOSE, CAPILLARY
Glucose-Capillary: 128 mg/dL — ABNORMAL HIGH (ref 70–99)
Glucose-Capillary: 143 mg/dL — ABNORMAL HIGH (ref 70–99)
Glucose-Capillary: 132 mg/dL — ABNORMAL HIGH (ref 70–99)
Glucose-Capillary: 132 mg/dL — ABNORMAL HIGH (ref 70–99)
Glucose-Capillary: 132 mg/dL — ABNORMAL HIGH (ref 70–99)

## 2019-12-26 LAB — BASIC METABOLIC PANEL
Anion gap: 8 (ref 5–15)
BUN: 28 mg/dL — ABNORMAL HIGH (ref 6–20)
CO2: 22 mmol/L (ref 22–32)
Calcium: 8.3 mg/dL — ABNORMAL LOW (ref 8.9–10.3)
Chloride: 111 mmol/L (ref 98–111)
Creatinine, Ser: 0.88 mg/dL (ref 0.61–1.24)
GFR calc Af Amer: >60 mL/min (ref >60)
GFR calc non Af Amer: >60 mL/min (ref >60)
Glucose, Bld: 137 mg/dL — ABNORMAL HIGH (ref 70–99)
Potassium: 4.1 mmol/L (ref 3.5–5.1)
Sodium: 141 mmol/L (ref 135–145)

## 2019-12-26 LAB — CBC
HCT: 38.7 % — ABNORMAL LOW (ref 39.0–52.0)
Hemoglobin: 12.9 g/dL — ABNORMAL LOW (ref 13.0–17.0)
MCH: 29.7 pg (ref 26.0–34.0)
MCHC: 33.3 g/dL (ref 30.0–36.0)
MCV: 89 fL (ref 80.0–100.0)
Platelets: 263 10*3/uL (ref 150–400)
RBC: 4.35 MIL/uL (ref 4.22–5.81)
RDW: 13.7 % (ref 11.5–15.5)
WBC: 15.6 10*3/uL — ABNORMAL HIGH (ref 4.0–10.5)
nRBC: 0.1 % (ref 0.0–0.2)

## 2019-12-26 NOTE — Progress Notes (Signed)
Updated CDS via phone. Nothing new to report.

## 2019-12-26 NOTE — Progress Notes (Signed)
NAME:  Nathaniel Casey, MRN:  [id number], DOB:  03/23/1966, LOS: 7 ADMISSION DATE:  01-18-20, CONSULTATION DATE:  REFERRING MD:  ED, CHIEF COMPLAINT:  Cardiac arrest   Brief History   54 y/o M with no known medical history admitted 2/20 after calling EMS for SOB.  Found down with inhaler in hand / PEA Arrest.  Required 8 minutes CPR prior to ROSC. Concern for myoclonus on admit.   Past Medical History  Asthma - noncompliant with regimen per cousin  Never [place] [facility] Events   2/20 Admit post PEA arrest  2/21 Ongoing rhythmic jerking, increasing when propofol paused. Propofol resumed.  2/22 Propofol off, followed some commands for neurology 2/24 Not following commands, remains off sedation.  EEG with severe encephalopathy 2/25 Family meeting, recommendations for comfort focused care  Consults:  Neurology   Procedures:  ETT 2/20 >>  Significant Diagnostic Tests:   Head CT 2/20 > no acute intracranial abnormality   ECHO 2/21 >> LVEF 45-50%, LV regional wall motion abnormality, mild LVH, RV systolic function normal  EEG 2/24 >> severe diffuse encephalopathy, non-specific to etiology.  Could be anoxic injury. Episodes of twitching on 2/23 without concomitant EEG changes   MRI Brain 2/24 >> widespread cerebral and cerebellar edema consistent with hypoxic-ischemic injury. No herniation or hemorrhage  Micro Data:  Influenza A/B 2/20 >> negative  COVID 2/20 >> negative  MRSA PCR 2/21 >> negative  RVP 2/20 >> negative  BCx2 2/20 >>   Antimicrobials:  Azithromycin 2/20 x1 Rocephin 2/20 x1   Interim history/subjective:  No events. Remains obtunded.  Objective   Blood pressure 128/78, pulse 88, temperature 99.1 F (37.3 C), temperature source Oral, resp. rate (!) 22, height 5\' 7"  (1.702 m), weight 69 kg, SpO2 100 %.    Vent Mode: CPAP;PSV FiO2 (%):  [30 %-40 %] 30 % Set Rate:  [22 bmp] 22 bmp Vt Set:  [520 mL] 520 mL PEEP:  [5 cmH20] 5 cmH20 Pressure  Support:  [10 cmH20] 10 cmH20 Plateau Pressure:  [16 cmH20-18 cmH20] 17 cmH20   Intake/Output Summary (Last 24 hours) at 12/26/2019 1003 Last data filed at 12/26/2019 0800 Gross per 24 hour  Intake 2211.69 ml  Output 1075 ml  Net 1136.69 ml   Filed Weights   12/23/19 0413 12/25/19 0325 12/26/19 0312  Weight: 71.3 kg 70.9 kg 69 kg    Examination: General: Critically ill-appearing adult male in no acute distress on vent HEENT: MM pink/moist, ETT Neuro: GCS3, strong cough, triggers vent, pupils reactive CV: s1s2 RRR, no m/r/g PULM: Nonlabored on full support, vent assisted breath sounds bilaterally GI: soft, bsx4 active  Extremities: warm/dry, trace generalized edema  Skin: no rashes or lesions  BMP, CBC look okay, no further need to trend  Resolved Hospital Problem list   AKI  Hyopkalemia Electrolyte Disturbances   Assessment & Plan:   PEA Cardiac Arrest  Thought to be respiratory in origin given SOB complaints (reported hx of asthma, non-compliant).  Unknown downtime, required 8 minutes CPR prior to ROSC. ECHO benign.    -monitor in ICU    Acute Hypoxic Encephalopathy  Suspected Myoclonus  MRI brain consistent with severe anoxic injury -Continue propofol for comfort in the setting of severe myoclonus -Appreciate neurology assistance with patient care -Follow serial neuro exams -Continue Keppra -Given MRI findings concerning for poor prognosis for meaningful neurological recovery  Acute Respiratory Failure secondary to Cardiac Arrest  -PRVC 8 cc/kg -Wean PEEP/FiO2 for sats greater  than 90% -Trach/PEG would not likely be in patient's best interest -Follow intermittent chest x-ray -Continue scopolamine for secretions  Hypertension -Continue Norvasc   Suspected Asthma Flare  -Continue Brovana, Pulmicort  Hyperglycemia  -SSI, sensitive scale   At Risk Malnutrition -TF per nutrition  At Risk Electrolyte Disturbances  Hypokalemia -Trend BMP -KCl  2/26  Goals of Care- continue to address with family   Labs   Recent Labs  Lab 12/18/2019 1712 12/08/2019 1907 12/20/19 0148 12/20/19 0148 12/20/19 0410 12/20/19 1209 12/20/19 1617 12/20/19 2013 12/21/19 6659 12/21/19 9357 12/21/19 1645 12/22/19 0112 12/22/19 0112 12/23/19 0177 12/23/19 9390 12/24/19 0233 12/24/19 0233 12/25/19 0655 12/26/19 0302  NA   < >  --  140   < >   < >  --   --   --  139   < >  --  137  --  141  --  144  --  139 141  K   < >  --  3.5   < >   < >  --   --   --  4.6   < >  --  4.3   < > 3.8   < > 3.7   < > 2.8* 4.1  CL   < >  --  106   < >  --   --   --   --  108   < >  --  104  --  106  --  109  --  104 111  CO2   < >  --  21*   < >  --   --   --   --  21*   < >  --  22  --  26  --  26  --  23 22  GLUCOSE   < >  --  184*   < >  --   --   --   --  152*   < >  --  168*  --  152*  --  146*  --  138* 137*  BUN   < >  --  12   < >  --   --   --   --  18   < >  --  24*  --  24*  --  30*  --  30* 28*  CREATININE   < >  --  1.16   < >  --   --   --   --  1.07   < >  --  0.91  --  0.94  --  0.94  --  0.80 0.88  CALCIUM   < >  --  7.5*   < >  --   --   --   --  8.2*   < >  --  8.4*  --  8.4*  --  8.7*  --  8.4* 8.3*  MG  --  2.3 1.5*  --   --   --   --  2.9* 2.5*  --   --   --   --   --   --   --   --   --   --   PHOS   < > 5.2* 1.2*  --   --  3.6 2.6  --  3.0  --  3.1  --   --   --   --   --   --   --   --    < > =  values in this interval not displayed.   Recent Labs  Lab 12/24/19 0233 12/25/19 0655 12/26/19 0302  HGB 12.7* 12.3* 12.9*  HCT 38.3* 36.1* 38.7*  WBC 15.3* 14.9* 15.6*  PLT 289 238 263     Best practice:  Diet: TF Pain/Anxiety/Delirium protocol (if indicated): PRN fentanyl low dose, propofol gtt VAP protocol (if indicated): Ordered DVT prophylaxis: SCD SQH GI prophylaxis: Protonix Glucose control: SSI Mobility: Bedrest Code Status: Full Family Communication: will update when they come in Disposition: ICU     The patient is  critically ill with multiple organ systems failure and requires high complexity decision making for assessment and support, frequent evaluation and titration of therapies, application of advanced monitoring technologies and extensive interpretation of multiple databases. Critical Care Time devoted to patient care services described in this note independent of APP/resident time (if applicable)  is 32 minutes.   Myrla Halsted MD [facility] 12/26/2019 10:11 AM Personal pager: [phone] If unanswered, please page CCM On-call: [phone]

## 2019-12-27 LAB — GLUCOSE, CAPILLARY
Glucose-Capillary: 136 mg/dL — ABNORMAL HIGH (ref 70–99)
Glucose-Capillary: 139 mg/dL — ABNORMAL HIGH (ref 70–99)
Glucose-Capillary: 144 mg/dL — ABNORMAL HIGH (ref 70–99)
Glucose-Capillary: 146 mg/dL — ABNORMAL HIGH (ref 70–99)
Glucose-Capillary: 166 mg/dL — ABNORMAL HIGH (ref 70–99)
Glucose-Capillary: 178 mg/dL — ABNORMAL HIGH (ref 70–99)

## 2019-12-27 LAB — TRIGLYCERIDES: Triglycerides: 89 mg/dL (ref <150)

## 2019-12-27 NOTE — Progress Notes (Signed)
NAME:  Nathaniel Casey, MRN:  [id number], DOB:  [birth year], LOS: 8 ADMISSION DATE:  12/17/2019, CONSULTATION DATE:  REFERRING MD:  ED, CHIEF COMPLAINT:  Cardiac arrest   Brief History   54 y/o M with no known medical history admitted 2/20 after calling EMS for SOB.  Found down with inhaler in hand / PEA Arrest.  Required 8 minutes CPR prior to ROSC. Concern for myoclonus on admit.   Past Medical History  Asthma - noncompliant with regimen per cousin  Never Smoker   Significant Hospital Events   2/20 Admit post PEA arrest  2/21 Ongoing rhythmic jerking, increasing when propofol paused. Propofol resumed.  2/22 Propofol off, followed some commands for neurology 2/24 Not following commands, remains off sedation.  EEG with severe encephalopathy 2/25 Family meeting, recommendations for comfort focused care  Consults:  Neurology   Procedures:  ETT 2/20 >>  Significant Diagnostic Tests:   Head CT 2/20 > no acute intracranial abnormality   ECHO 2/21 >> LVEF 45-50%, LV regional wall motion abnormality, mild LVH, RV systolic function normal  EEG 0/86 >> severe diffuse encephalopathy, non-specific to etiology.  Could be anoxic injury. Episodes of twitching on 2/23 without concomitant EEG changes   MRI Brain 2/24 >> widespread cerebral and cerebellar edema consistent with hypoxic-ischemic injury. No herniation or hemorrhage  Micro Data:  Influenza A/B 2/20 >> negative  COVID 2/20 >> negative  MRSA PCR 2/21 >> negative  RVP 2/20 >> negative  BCx2 2/20 >>   Antimicrobials:  Azithromycin 2/20 x1 Rocephin 2/20 x1   Interim history/subjective:  No events. Remains obtunded.  Objective   Blood pressure 115/71, pulse 91, temperature 99.4 F (37.4 C), temperature source Axillary, resp. rate (!) 22, height 5\' 7"  (1.702 m), weight 70.1 kg, SpO2 100 %.    Vent Mode: PRVC FiO2 (%):  [30 %] 30 % Set Rate:  [22 bmp] 22 bmp Vt Set:  [520 mL] 520 mL PEEP:  [5 cmH20] 5 cmH20 Pressure  Support:  [10 cmH20] 10 cmH20 Plateau Pressure:  [15 cmH20-18 cmH20] 16 cmH20   Intake/Output Summary (Last 24 hours) at 12/27/2019 0947 Last data filed at 12/27/2019 0900 Gross per 24 hour  Intake 2238.65 ml  Output 1500 ml  Net 738.65 ml   Filed Weights   12/25/19 0325 12/26/19 0312 12/27/19 0411  Weight: 70.9 kg 69 kg 70.1 kg    Examination: General: Critically ill-appearing adult male in no acute distress on vent HEENT: MM pink/moist, ETT Neuro: GCS3, strong cough, triggers vent, pupils reactive CV: RRR, ext warm PULM: Does trigger vent, no wheezing GI: soft, bsx4 active  Extremities: warm/dry, trace generalized edema  Skin: no rashes or lesions   Resolved Hospital Problem list   AKI  Hyopkalemia Electrolyte Disturbances   Assessment & Plan:   PEA Cardiac Arrest  Thought to be respiratory in origin given SOB complaints (reported hx of asthma, non-compliant).  Unknown downtime, required 8 minutes CPR prior to ROSC. ECHO benign.    -monitor in ICU    Acute Hypoxic Encephalopathy  Suspected Myoclonus  MRI brain consistent with severe anoxic injury -Continue propofol for comfort in the setting of severe myoclonus -Appreciate neurology assistance with patient care -Follow serial neuro exams -Continue Keppra - Meaningful recovery is not expected  Acute Respiratory Failure secondary to Cardiac Arrest  -PRVC 8 cc/kg -Wean PEEP/FiO2 for sats greater than 90% -Follow intermittent chest x-ray -Continue scopolamine for secretions  Hypertension -Continue Norvasc   Suspected Asthma Flare now  resolved -Continue Brovana, Pulmicort  Hyperglycemia  -SSI, sensitive scale   At Risk Malnutrition -TF per nutrition  Goals of Care- called mother 2/27 afternoon, they would like some time to process further.  I told them to reach a decision regarding Trach/PEG/LTACH vs. Allowing Nathaniel Casey to pass in peace by Monday.   Labs   Recent Labs  Lab 12/20/19 1209 12/20/19 1617  12/20/19 2013 12/21/19 5462 12/21/19 0433 12/21/19 1645 12/22/19 0112 12/22/19 0112 12/23/19 0312 12/23/19 0312 12/24/19 0233 12/24/19 0233 12/25/19 0655 12/26/19 0302  NA  --   --   --  139   < >  --  137  --  141  --  144  --  139 141  K  --   --   --  4.6   < >  --  4.3   < > 3.8   < > 3.7   < > 2.8* 4.1  CL  --   --   --  108   < >  --  104  --  106  --  109  --  104 111  CO2  --   --   --  21*   < >  --  22  --  26  --  26  --  23 22  GLUCOSE  --   --   --  152*   < >  --  168*  --  152*  --  146*  --  138* 137*  BUN  --   --   --  18   < >  --  24*  --  24*  --  30*  --  30* 28*  CREATININE  --   --   --  1.07   < >  --  0.91  --  0.94  --  0.94  --  0.80 0.88  CALCIUM  --   --   --  8.2*   < >  --  8.4*  --  8.4*  --  8.7*  --  8.4* 8.3*  MG  --   --  2.9* 2.5*  --   --   --   --   --   --   --   --   --   --   PHOS 3.6 2.6  --  3.0  --  3.1  --   --   --   --   --   --   --   --    < > = values in this interval not displayed.   Recent Labs  Lab 12/24/19 0233 12/25/19 0655 12/26/19 0302  HGB 12.7* 12.3* 12.9*  HCT 38.3* 36.1* 38.7*  WBC 15.3* 14.9* 15.6*  PLT 289 238 263     Best practice:  Diet: TF Pain/Anxiety/Delirium protocol (if indicated): PRN fentanyl low dose, propofol gtt VAP protocol (if indicated): Ordered DVT prophylaxis: SCD SQH GI prophylaxis: Protonix Glucose control: SSI Mobility: Bedrest Code Status: Full Family Communication: will update when they come in Disposition: ICU     The patient is critically ill with multiple organ systems failure and requires high complexity decision making for assessment and support, frequent evaluation and titration of therapies, application of advanced monitoring technologies and extensive interpretation of multiple databases. Critical Care Time devoted to patient care services described in this note independent of APP/resident time (if applicable)  is 31 minutes.   Myrla Halsted MD [facility] Pulmonary Critical  Care 12/27/2019  9:47 AM Personal pager: [phone] If unanswered, please page CCM On-call: [phone]

## 2019-12-28 ENCOUNTER — Inpatient Hospital Stay (HOSPITAL_COMMUNITY): Payer: 59

## 2019-12-28 ENCOUNTER — Encounter (HOSPITAL_COMMUNITY): Payer: Self-pay

## 2019-12-28 ENCOUNTER — Inpatient Hospital Stay: Payer: Self-pay

## 2019-12-28 LAB — GLUCOSE, CAPILLARY
Glucose-Capillary: 121 mg/dL — ABNORMAL HIGH (ref 70–99)
Glucose-Capillary: 123 mg/dL — ABNORMAL HIGH (ref 70–99)
Glucose-Capillary: 128 mg/dL — ABNORMAL HIGH (ref 70–99)
Glucose-Capillary: 131 mg/dL — ABNORMAL HIGH (ref 70–99)
Glucose-Capillary: 144 mg/dL — ABNORMAL HIGH (ref 70–99)
Glucose-Capillary: 150 mg/dL — ABNORMAL HIGH (ref 70–99)

## 2019-12-28 LAB — POCT I-STAT 7, (LYTES, BLD GAS, ICA,H+H)
Acid-Base Excess: 1 mmol/L (ref 0.0–2.0)
Bicarbonate: 24.9 mmol/L (ref 20.0–28.0)
Calcium, Ion: 1.2 mmol/L (ref 1.15–1.40)
HCT: 35 % — ABNORMAL LOW (ref 39.0–52.0)
Hemoglobin: 11.9 g/dL — ABNORMAL LOW (ref 13.0–17.0)
O2 Saturation: 99 %
Patient temperature: 98.6
Potassium: 4.3 mmol/L (ref 3.5–5.1)
Sodium: 137 mmol/L (ref 135–145)
TCO2: 26 mmol/L (ref 22–32)
pCO2 arterial: 36.3 mmHg (ref 32.0–48.0)
pH, Arterial: 7.444 (ref 7.350–7.450)
pO2, Arterial: 113 mmHg — ABNORMAL HIGH (ref 83.0–108.0)

## 2019-12-28 LAB — RESPIRATORY PANEL BY RT PCR (FLU A&B, COVID)
Influenza A by PCR: NEGATIVE
Influenza B by PCR: NEGATIVE
SARS Coronavirus 2 by RT PCR: NEGATIVE

## 2019-12-28 LAB — CBC WITH DIFFERENTIAL/PLATELET
Abs Immature Granulocytes: 1.17 10*3/uL — ABNORMAL HIGH (ref 0.00–0.07)
Basophils Absolute: 0.1 10*3/uL (ref 0.0–0.1)
Basophils Relative: 1 %
Eosinophils Absolute: 0.8 10*3/uL — ABNORMAL HIGH (ref 0.0–0.5)
Eosinophils Relative: 4 %
HCT: 36.1 % — ABNORMAL LOW (ref 39.0–52.0)
Hemoglobin: 11.9 g/dL — ABNORMAL LOW (ref 13.0–17.0)
Immature Granulocytes: 7 %
Lymphocytes Relative: 11 %
Lymphs Abs: 1.9 10*3/uL (ref 0.7–4.0)
MCH: 29.7 pg (ref 26.0–34.0)
MCHC: 33 g/dL (ref 30.0–36.0)
MCV: 90 fL (ref 80.0–100.0)
Monocytes Absolute: 0.9 10*3/uL (ref 0.1–1.0)
Monocytes Relative: 5 %
Neutro Abs: 12.6 10*3/uL — ABNORMAL HIGH (ref 1.7–7.7)
Neutrophils Relative %: 72 %
Platelets: 273 10*3/uL (ref 150–400)
RBC: 4.01 MIL/uL — ABNORMAL LOW (ref 4.22–5.81)
RDW: 14 % (ref 11.5–15.5)
WBC: 17.5 10*3/uL — ABNORMAL HIGH (ref 4.0–10.5)
nRBC: 0 % (ref 0.0–0.2)

## 2019-12-28 LAB — ABO/RH: ABO/RH(D): O POS

## 2019-12-28 LAB — URINALYSIS, ROUTINE W REFLEX MICROSCOPIC
Bilirubin Urine: NEGATIVE
Glucose, UA: NEGATIVE mg/dL
Hgb urine dipstick: NEGATIVE
Ketones, ur: NEGATIVE mg/dL
Leukocytes,Ua: NEGATIVE
Nitrite: NEGATIVE
Protein, ur: NEGATIVE mg/dL
Specific Gravity, Urine: 1.025 (ref 1.005–1.030)
pH: 5 (ref 5.0–8.0)

## 2019-12-28 LAB — COMPREHENSIVE METABOLIC PANEL
ALT: 282 U/L — ABNORMAL HIGH (ref 0–44)
AST: 147 U/L — ABNORMAL HIGH (ref 15–41)
Albumin: 2.5 g/dL — ABNORMAL LOW (ref 3.5–5.0)
Alkaline Phosphatase: 79 U/L (ref 38–126)
Anion gap: 8 (ref 5–15)
BUN: 25 mg/dL — ABNORMAL HIGH (ref 6–20)
CO2: 23 mmol/L (ref 22–32)
Calcium: 8.3 mg/dL — ABNORMAL LOW (ref 8.9–10.3)
Chloride: 106 mmol/L (ref 98–111)
Creatinine, Ser: 0.72 mg/dL (ref 0.61–1.24)
GFR calc Af Amer: >60 mL/min (ref >60)
GFR calc non Af Amer: >60 mL/min (ref >60)
Glucose, Bld: 123 mg/dL — ABNORMAL HIGH (ref 70–99)
Potassium: 4.3 mmol/L (ref 3.5–5.1)
Sodium: 137 mmol/L (ref 135–145)
Total Bilirubin: 0.8 mg/dL (ref 0.3–1.2)
Total Protein: 5.5 g/dL — ABNORMAL LOW (ref 6.5–8.1)

## 2019-12-28 LAB — MAGNESIUM: Magnesium: 2.1 mg/dL (ref 1.7–2.4)

## 2019-12-28 LAB — PROTIME-INR
INR: 0.9 (ref 0.8–1.2)
Prothrombin Time: 12.1 seconds (ref 11.4–15.2)

## 2019-12-28 LAB — PHOSPHORUS: Phosphorus: 4.3 mg/dL (ref 2.5–4.6)

## 2019-12-28 LAB — LACTATE DEHYDROGENASE: LDH: 448 U/L — ABNORMAL HIGH (ref 98–192)

## 2019-12-28 LAB — BILIRUBIN, DIRECT: Bilirubin, Direct: 0.2 mg/dL (ref 0.0–0.2)

## 2019-12-28 LAB — APTT: aPTT: 28 seconds (ref 24–36)

## 2019-12-28 LAB — GAMMA GT: GGT: 170 U/L — ABNORMAL HIGH (ref 7–50)

## 2019-12-28 MED ORDER — SODIUM CHLORIDE 0.9 % IV SOLN: INTRAVENOUS | Status: DC | PRN

## 2019-12-28 MED ORDER — MIDAZOLAM HCL 2 MG/2ML IJ SOLN: 2.0000 mg | INTRAMUSCULAR | Status: DC | PRN

## 2019-12-28 MED ORDER — DEXTROSE 5 % IV SOLN: INTRAVENOUS | Status: DC

## 2019-12-28 MED ORDER — DIPHENHYDRAMINE HCL 50 MG/ML IJ SOLN: 25.0000 mg | INTRAMUSCULAR | Status: DC | PRN

## 2019-12-28 MED ORDER — FENTANYL CITRATE (PF) 100 MCG/2ML IJ SOLN
25.0000 ug | INTRAMUSCULAR | Status: DC | PRN
  Administered 2019-12-28: 21:00:00 100 ug via INTRAVENOUS
  Administered 2019-12-28 – 2019-12-29 (×9): 50 ug via INTRAVENOUS
  Filled 2019-12-28 (×7): qty 2

## 2019-12-28 MED ORDER — HALOPERIDOL LACTATE 5 MG/ML IJ SOLN: 2.5000 mg | INTRAMUSCULAR | Status: DC | PRN

## 2019-12-28 MED ORDER — GLYCOPYRROLATE 0.2 MG/ML IJ SOLN: 0.2000 mg | INTRAMUSCULAR | Status: DC | PRN

## 2019-12-28 MED ORDER — LACTATED RINGERS IV SOLN: INTRAVENOUS | Status: DC

## 2019-12-28 MED ORDER — SODIUM CHLORIDE 0.9% IV SOLUTION: Freq: Once | INTRAVENOUS | Status: DC

## 2019-12-28 MED ORDER — GLYCOPYRROLATE 0.2 MG/ML IJ SOLN
0.2000 mg | INTRAMUSCULAR | Status: DC | PRN
  Administered 2019-12-29: 0.2 mg via INTRAVENOUS
  Filled 2019-12-28: qty 1

## 2019-12-28 MED ORDER — POLYVINYL ALCOHOL 1.4 % OP SOLN
1.0000 [drp] | Freq: Four times a day (QID) | OPHTHALMIC | Status: DC | PRN
  Filled 2019-12-28: qty 15

## 2019-12-28 MED ORDER — GLYCOPYRROLATE 1 MG PO TABS: 1.0000 mg | ORAL_TABLET | ORAL | Status: DC | PRN

## 2019-12-28 NOTE — Progress Notes (Signed)
NAME:  Nathaniel Casey, MRN:  [id number], DOB:  02-Feb-1966, LOS: 9 ADMISSION DATE:  01/02/20, CONSULTATION DATE:  REFERRING MD:  ED, CHIEF COMPLAINT:  Cardiac arrest   Brief History   54 y/o M with no known medical history admitted 2/20 after calling EMS for SOB.  Found down with inhaler in hand / PEA Arrest.  Required 8 minutes CPR prior to ROSC. Concern for myoclonus on admit.   Past Medical History  Asthma - noncompliant with regimen per cousin  Never [place] [facility] Events   2/20 Admit post PEA arrest  2/21 Ongoing rhythmic jerking, increasing when propofol paused. Propofol resumed.  2/22 Propofol off, followed some commands for neurology 2/24 Not following commands, remains off sedation.  EEG with severe encephalopathy 2/25 Family meeting, recommendations for comfort focused care  Consults:  Neurology   Procedures:  ETT 2/20 >>  Significant Diagnostic Tests:   Head CT 2/20 > no acute intracranial abnormality   ECHO 2/21 >> LVEF 45-50%, LV regional wall motion abnormality, mild LVH, RV systolic function normal  EEG 2/24 >> severe diffuse encephalopathy, non-specific to etiology.  Could be anoxic injury. Episodes of twitching on 2/23 without concomitant EEG changes   MRI Brain 2/24 >> widespread cerebral and cerebellar edema consistent with hypoxic-ischemic injury. No herniation or hemorrhage  Micro Data:  Influenza A/B 2/20 >> negative  COVID 2/20 >> negative  MRSA PCR 2/21 >> negative  RVP 2/20 >> negative  BCx2 2/20 >>   Antimicrobials:  Azithromycin 2/20 x1 Rocephin 2/20 x1   Interim history/subjective:  No events overnight. Remains on propofol for myoclonic jerking.  Objective   Blood pressure 122/75, pulse 88, temperature 98.4 F (36.9 C), temperature source Oral, resp. rate (!) 22, height 5\' 7"  (1.702 m), weight 68.6 kg, SpO2 100 %.    Vent Mode: PRVC FiO2 (%):  [30 %] 30 % Set Rate:  [22 bmp] 22 bmp Vt Set:  [520 mL] 520 mL PEEP:   [5 cmH20] 5 cmH20 Plateau Pressure:  [15 cmH20-20 cmH20] 19 cmH20   Intake/Output Summary (Last 24 hours) at 12/28/2019 1007 Last data filed at 12/28/2019 0900 Gross per 24 hour  Intake 2101.65 ml  Output 1365 ml  Net 736.65 ml   Filed Weights   12/26/19 0312 12/27/19 0411 12/28/19 0430  Weight: 69 kg 70.1 kg 68.6 kg   Physical Exam: General: Young-appearing, unresponsive HENT: [place], AT, ETT in place Eyes: EOMI, no scleral icterus Respiratory: Clear to auscultation bilaterally.  No crackles, wheezing or rales Cardiovascular: RRR, -M/R/G, no JVD GI: BS+, soft, nontender Extremities:-Edema,-tenderness Neuro: [facility] Problem list   AKI  Hyopkalemia Electrolyte Disturbances   Assessment & Plan:   PEA Cardiac Arrest  Thought to be respiratory in origin given SOB complaints (reported hx of asthma, non-compliant).  Unknown downtime, required 8 minutes CPR prior to ROSC. ECHO benign.    -monitor in ICU    Acute Hypoxic Encephalopathy  Suspected Myoclonus  MRI brain consistent with severe anoxic injury -Continue propofol for comfort in the setting of severe myoclonus -Appreciate neurology assistance with patient care -Follow serial neuro exams -Continue Keppra - Meaningful recovery is not expected  Acute Respiratory Failure secondary to Cardiac Arrest  -PRVC 8 cc/kg -Wean PEEP/FiO2 for sats greater than 90% -Follow intermittent chest x-ray -Continue scopolamine for secretions --Mental status precludes extubation  Hypertension -Continue Norvasc   Suspected Asthma Flare now resolved -Continue Brovana, Pulmicort  Hyperglycemia  -SSI, sensitive scale  At Risk Malnutrition -TF per nutrition  Goals of Care- Mother contacted on 2/27 afternoon, they would like some time to process further.  I told them to reach a decision regarding Trach/PEG/LTACH vs. Allowing [place] to pass in peace by Monday.  Will plan to discuss GOC of care today with family.  Unlikely to make meaningful recovery in setting of anoxic brain injury.   Labs   Recent Labs  Lab 12/21/19 1645 12/22/19 0112 12/22/19 0112 12/23/19 0981 12/23/19 1914 12/24/19 0233 12/24/19 0233 12/25/19 0655 12/26/19 0302  NA  --  137  --  141  --  144  --  139 141  K  --  4.3   < > 3.8   < > 3.7   < > 2.8* 4.1  CL  --  104  --  106  --  109  --  104 111  CO2  --  22  --  26  --  26  --  23 22  GLUCOSE  --  168*  --  152*  --  146*  --  138* 137*  BUN  --  24*  --  24*  --  30*  --  30* 28*  CREATININE  --  0.91  --  0.94  --  0.94  --  0.80 0.88  CALCIUM  --  8.4*  --  8.4*  --  8.7*  --  8.4* 8.3*  PHOS 3.1  --   --   --   --   --   --   --   --    < > = values in this interval not displayed.   Recent Labs  Lab 12/24/19 0233 12/25/19 0655 12/26/19 0302  HGB 12.7* 12.3* 12.9*  HCT 38.3* 36.1* 38.7*  WBC 15.3* 14.9* 15.6*  PLT 289 238 263     Best practice:  Diet: TF Pain/Anxiety/Delirium protocol (if indicated): PRN fentanyl low dose, propofol gtt VAP protocol (if indicated): Ordered DVT prophylaxis: SCD SQH GI prophylaxis: Protonix Glucose control: SSI Mobility: Bedrest Code Status: Full Family Communication: Plan for family meeting this afternoon Disposition: ICU   The patient is critically ill with multiple organ systems failure and requires high complexity decision making for assessment and support, frequent evaluation and titration of therapies, application of advanced monitoring technologies and extensive interpretation of multiple databases.   Critical Care Time devoted to patient care services described in this note is 31 Minutes. This time reflects time of care of this signee Dr. Mechele Collin.   Mechele Collin, M.D. [facility] Pulmonary/Critical Care Medicine 12/28/2019 10:08 AM   Please see Amion for pager number to reach on-call Pulmonary and Critical Care Team.

## 2019-12-28 NOTE — Progress Notes (Signed)
PCCM Interval Progress Note  Family wishes to pursue organ donation. Images and labs have been requested by [facility].  Mechele Collin, M.D. [facility] Pulmonary/Critical Care Medicine 12/28/2019 6:15 PM

## 2019-12-28 NOTE — Progress Notes (Signed)
Assisted tele visit to patient with family member.  Torre Schaumburg R, RN  

## 2019-12-28 NOTE — Progress Notes (Signed)
PCCM Interval Progress Note  Family meeting held with patient's mother and father. We discussed his hospital course including his arrest and findings consistent anoxic brain injury. In my opinion, he is unlikely to make meaningful neurologic recovery which has also been expressed by Neurology and other team members during this hospital course. His mother expresses understanding and agrees that prolonging his life at this point would not be consistent with his wishes. His mother and father agree to transition patient to comfort care and withdraw care.  Mechele Collin, M.D. [facility] Pulmonary/Critical Care Medicine 12/28/2019 4:41 PM

## 2019-12-28 NOTE — Procedures (Signed)
Arterial Catheter Insertion Procedure Note Nathaniel Casey [id number] 1966-01-23  Procedure: Insertion of Arterial Catheter  Indications: Blood pressure monitoring and Frequent blood sampling  Procedure Details Consent: Risks of procedure as well as the alternatives and risks of each were explained to the (patient/caregiver).  Consent for procedure obtained. and Unable to obtain consent because of emergent medical necessity. Time Out: Verified patient identification, verified procedure, site/side was marked, verified correct patient position, special equipment/implants available, medications/allergies/relevent history reviewed, required imaging and test results available.  Performed  Maximum sterile technique was used including antiseptics, cap, gloves, gown, hand hygiene, mask and sheet. Skin prep: Chlorhexidine; local anesthetic administered 20 gauge catheter was inserted into left radial artery using the Seldinger technique. ULTRASOUND GUIDANCE USED: YES Evaluation Blood flow good; BP tracing good. Complications: No apparent complications.   Leafy Half 12/28/2019

## 2019-12-28 NOTE — Progress Notes (Signed)
Nutrition Follow-up  DOCUMENTATION CODES:   Not applicable  INTERVENTION:   Continue TF via OG tube:   Vital AF 1.2 at 65 ml/h (1560 ml per day)   Provides 1872 kcal, 117 gm protein, 1265 ml free water daily  NUTRITION DIAGNOSIS:   Inadequate oral intake related to inability to eat as evidenced by NPO status.  GOAL:   Patient will meet greater than or equal to 90% of their needs  MONITOR:   Vent status, TF tolerance, Labs  REASON FOR ASSESSMENT:   Ventilator, Consult Enteral/tube feeding initiation and management  ASSESSMENT:   54 yo male admitted S/P PEA arrest. PMH includes arthritis.   Discussed patient in ICU rounds and with RN today. Plans for family meeting this afternoon to discuss goals of care.   Patient remains intubated on ventilator support MV: 10.7 L/min Temp (24hrs), Avg:98.7 F (37.1 C), Min:98.1 F (36.7 C), Max:99.6 F (37.6 C)  Propofol: 12.9 ml/h providing 341 kcal from lipid.  Labs reviewed.  CBG's: [phone]  Medications reviewed and include Novolog, Keppra, Propofol.    Diet Order:   Diet Order            Diet NPO time specified  Diet effective now              EDUCATION NEEDS:   Not appropriate for education at this time  Skin:  Skin Assessment: Reviewed RN Assessment  Last BM:  2/25  Height:   Ht Readings from Last 1 Encounters:  12-21-19 5\' 7"  (1.702 m)    Weight:   Wt Readings from Last 1 Encounters:  12/28/19 68.6 kg    Ideal Body Weight:  67.3 kg  BMI:  Body mass index is 23.69 kg/m.  Estimated Nutritional Needs:   Kcal:  1840  Protein:  100-115 gm  Fluid:  >/= 1.9 L    02/27/20, RD, LDN, CNSC Please refer to Amion for contact information.

## 2019-12-28 DEATH — deceased

## 2019-12-29 ENCOUNTER — Inpatient Hospital Stay (HOSPITAL_COMMUNITY): Payer: 59

## 2019-12-29 ENCOUNTER — Encounter (HOSPITAL_COMMUNITY): Payer: Self-pay | Admitting: Certified Registered Nurse Anesthetist

## 2019-12-29 LAB — GLUCOSE, CAPILLARY
Glucose-Capillary: 110 mg/dL — ABNORMAL HIGH (ref 70–99)
Glucose-Capillary: 113 mg/dL — ABNORMAL HIGH (ref 70–99)
Glucose-Capillary: 117 mg/dL — ABNORMAL HIGH (ref 70–99)
Glucose-Capillary: 87 mg/dL (ref 70–99)
Glucose-Capillary: 94 mg/dL (ref 70–99)
Glucose-Capillary: 95 mg/dL (ref 70–99)
Glucose-Capillary: 97 mg/dL (ref 70–99)

## 2019-12-29 LAB — COMPREHENSIVE METABOLIC PANEL
ALT: 249 U/L — ABNORMAL HIGH (ref 0–44)
ALT: 270 U/L — ABNORMAL HIGH (ref 0–44)
AST: 121 U/L — ABNORMAL HIGH (ref 15–41)
AST: 128 U/L — ABNORMAL HIGH (ref 15–41)
Albumin: 2.2 g/dL — ABNORMAL LOW (ref 3.5–5.0)
Albumin: 2.2 g/dL — ABNORMAL LOW (ref 3.5–5.0)
Alkaline Phosphatase: 72 U/L (ref 38–126)
Alkaline Phosphatase: 70 U/L (ref 38–126)
Anion gap: 8 (ref 5–15)
Anion gap: 10 (ref 5–15)
BUN: 23 mg/dL — ABNORMAL HIGH (ref 6–20)
BUN: 22 mg/dL — ABNORMAL HIGH (ref 6–20)
CO2: 23 mmol/L (ref 22–32)
CO2: 21 mmol/L — ABNORMAL LOW (ref 22–32)
Calcium: 8.1 mg/dL — ABNORMAL LOW (ref 8.9–10.3)
Calcium: 8.1 mg/dL — ABNORMAL LOW (ref 8.9–10.3)
Chloride: 106 mmol/L (ref 98–111)
Chloride: 105 mmol/L (ref 98–111)
Creatinine, Ser: 0.77 mg/dL (ref 0.61–1.24)
Creatinine, Ser: 0.69 mg/dL (ref 0.61–1.24)
GFR calc Af Amer: >60 mL/min (ref >60)
GFR calc Af Amer: >60 mL/min (ref >60)
GFR calc non Af Amer: >60 mL/min (ref >60)
GFR calc non Af Amer: >60 mL/min (ref >60)
Glucose, Bld: 126 mg/dL — ABNORMAL HIGH (ref 70–99)
Glucose, Bld: 118 mg/dL — ABNORMAL HIGH (ref 70–99)
Potassium: 3.9 mmol/L (ref 3.5–5.1)
Potassium: 4.1 mmol/L (ref 3.5–5.1)
Sodium: 137 mmol/L (ref 135–145)
Sodium: 136 mmol/L (ref 135–145)
Total Bilirubin: 0.5 mg/dL (ref 0.3–1.2)
Total Bilirubin: 0.5 mg/dL (ref 0.3–1.2)
Total Protein: 5.4 g/dL — ABNORMAL LOW (ref 6.5–8.1)
Total Protein: 5.4 g/dL — ABNORMAL LOW (ref 6.5–8.1)

## 2019-12-29 LAB — POCT I-STAT 7, (LYTES, BLD GAS, ICA,H+H)
Acid-Base Excess: 6 mmol/L — ABNORMAL HIGH (ref 0.0–2.0)
Acid-Base Excess: 2 mmol/L (ref 0.0–2.0)
Bicarbonate: 28.1 mmol/L — ABNORMAL HIGH (ref 20.0–28.0)
Bicarbonate: 25.1 mmol/L (ref 20.0–28.0)
Calcium, Ion: 1.19 mmol/L (ref 1.15–1.40)
Calcium, Ion: 1.16 mmol/L (ref 1.15–1.40)
HCT: 31 % — ABNORMAL LOW (ref 39.0–52.0)
HCT: 33 % — ABNORMAL LOW (ref 39.0–52.0)
Hemoglobin: 10.5 g/dL — ABNORMAL LOW (ref 13.0–17.0)
Hemoglobin: 11.2 g/dL — ABNORMAL LOW (ref 13.0–17.0)
O2 Saturation: 100 %
O2 Saturation: 99 %
Patient temperature: 99
Patient temperature: 98.4
Potassium: 3.7 mmol/L (ref 3.5–5.1)
Potassium: 3.9 mmol/L (ref 3.5–5.1)
Sodium: 136 mmol/L (ref 135–145)
Sodium: 136 mmol/L (ref 135–145)
TCO2: 29 mmol/L (ref 22–32)
TCO2: 26 mmol/L (ref 22–32)
pCO2 arterial: 32.7 mmHg (ref 32.0–48.0)
pCO2 arterial: 33.3 mmHg (ref 32.0–48.0)
pH, Arterial: 7.542 — ABNORMAL HIGH (ref 7.350–7.450)
pH, Arterial: 7.485 — ABNORMAL HIGH (ref 7.350–7.450)
pO2, Arterial: 456 mmHg — ABNORMAL HIGH (ref 83.0–108.0)
pO2, Arterial: 119 mmHg — ABNORMAL HIGH (ref 83.0–108.0)

## 2019-12-29 LAB — CBC WITH DIFFERENTIAL/PLATELET
Abs Immature Granulocytes: 0.76 10*3/uL — ABNORMAL HIGH (ref 0.00–0.07)
Basophils Absolute: 0.1 10*3/uL (ref 0.0–0.1)
Basophils Relative: 1 %
Eosinophils Absolute: 0.6 10*3/uL — ABNORMAL HIGH (ref 0.0–0.5)
Eosinophils Relative: 3 %
HCT: 32 % — ABNORMAL LOW (ref 39.0–52.0)
Hemoglobin: 10.6 g/dL — ABNORMAL LOW (ref 13.0–17.0)
Immature Granulocytes: 4 %
Lymphocytes Relative: 10 %
Lymphs Abs: 1.7 10*3/uL (ref 0.7–4.0)
MCH: 29.6 pg (ref 26.0–34.0)
MCHC: 33.1 g/dL (ref 30.0–36.0)
MCV: 89.4 fL (ref 80.0–100.0)
Monocytes Absolute: 0.9 10*3/uL (ref 0.1–1.0)
Monocytes Relative: 5 %
Neutro Abs: 13.3 10*3/uL — ABNORMAL HIGH (ref 1.7–7.7)
Neutrophils Relative %: 77 %
Platelets: 267 10*3/uL (ref 150–400)
RBC: 3.58 MIL/uL — ABNORMAL LOW (ref 4.22–5.81)
RDW: 13.8 % (ref 11.5–15.5)
WBC: 17.3 10*3/uL — ABNORMAL HIGH (ref 4.0–10.5)
nRBC: 0 % (ref 0.0–0.2)

## 2019-12-29 LAB — URINALYSIS, ROUTINE W REFLEX MICROSCOPIC
Bilirubin Urine: NEGATIVE
Glucose, UA: NEGATIVE mg/dL
Hgb urine dipstick: NEGATIVE
Ketones, ur: NEGATIVE mg/dL
Leukocytes,Ua: NEGATIVE
Nitrite: NEGATIVE
Protein, ur: NEGATIVE mg/dL
Specific Gravity, Urine: 1.014 (ref 1.005–1.030)
pH: 6 (ref 5.0–8.0)

## 2019-12-29 LAB — PROTIME-INR
INR: 0.9 (ref 0.8–1.2)
Prothrombin Time: 12.4 seconds (ref 11.4–15.2)

## 2019-12-29 LAB — APTT: aPTT: 25 seconds (ref 24–36)

## 2019-12-29 MED ORDER — ORAL CARE MOUTH RINSE
15.0000 mL | OROMUCOSAL | Status: DC
  Administered 2019-12-29 – 2019-12-30 (×3): 15 mL via OROMUCOSAL

## 2019-12-29 MED ORDER — CHLORHEXIDINE GLUCONATE 0.12% ORAL RINSE (MEDLINE KIT)
15.0000 mL | Freq: Two times a day (BID) | OROMUCOSAL | Status: DC
  Administered 2019-12-29: 15 mL via OROMUCOSAL

## 2019-12-29 MED ORDER — PIPERACILLIN-TAZOBACTAM 3.375 G IVPB
3.3750 g | Freq: Once | INTRAVENOUS | Status: AC
  Administered 2019-12-30: 3.375 g via INTRAVENOUS
  Filled 2019-12-29: qty 50

## 2019-12-29 MED ORDER — MORPHINE 100MG IN NS 100ML (1MG/ML) PREMIX INFUSION
1.0000 mg/h | INTRAVENOUS | Status: DC
  Administered 2019-12-30: 5 mg/h via INTRAVENOUS
  Filled 2019-12-29: qty 100

## 2019-12-29 MED ORDER — HEPARIN SODIUM (PORCINE) 5000 UNIT/ML IJ SOLN
6950.0000 [IU] | INTRAMUSCULAR | Status: AC
  Administered 2019-12-30: 6950 [IU] via INTRAVENOUS
  Filled 2019-12-29: qty 2

## 2019-12-29 MED ORDER — IPRATROPIUM-ALBUTEROL 0.5-2.5 (3) MG/3ML IN SOLN
3.0000 mL | RESPIRATORY_TRACT | Status: DC | PRN
  Administered 2019-12-29: 3 mL via RESPIRATORY_TRACT
  Filled 2019-12-29: qty 3

## 2019-12-29 NOTE — Progress Notes (Signed)
Spoke to [employer], pt transiting to comfort care and the PICC order will be canceled. Consuello Masse

## 2019-12-29 NOTE — Progress Notes (Signed)
eLink Physician-Brief Progress Note Patient Name: Nathaniel Casey DOB: [birth year] MRN: [id number]   Date of Service  12/29/2019  HPI/Events of Note  CDS requests Duoneb now and CMP in AM.   eICU Interventions  Will order: 1. CMP in AM. 2. Duonebs Q 4 hours PRN.      Intervention Category Major Interventions: Other:  Lenell Antu 12/29/2019, 2:41 AM

## 2019-12-29 NOTE — Progress Notes (Signed)
Nathaniel Casey with questions for family. Emotional support offered, questions answered. Nathaniel Casey also spoke with Kathlene November w/CDS for clarification on some processes.

## 2019-12-29 NOTE — Progress Notes (Signed)
Touched base w/ Ms. Ross and Ms. Vear Clock, pt's mother, to update on patient. Also clarified w/ mother that family does not wish to be present for tx to OR or see pt before, having had time with him prior to today. Will update family as processes transition.

## 2019-12-29 NOTE — Progress Notes (Signed)
PCCM Progress Note  Patient is under the care of the [facility] [email or web address] PCCM team available to help as needed.  Mechele Collin, M.D. [facility] Pulmonary/Critical Care Medicine 12/29/2019 10:18 AM   No charges placed.

## 2019-12-30 ENCOUNTER — Encounter (HOSPITAL_COMMUNITY): Admission: EM | Disposition: E | Payer: Self-pay | Source: Home / Self Care | Attending: Internal Medicine

## 2019-12-30 HISTORY — PX: ORGAN PROCUREMENT: SHX5270

## 2019-12-30 LAB — URINE CULTURE: Culture: NO GROWTH

## 2019-12-30 SURGERY — SURGICAL PROCUREMENT, ORGAN
Anesthesia: General | Site: Abdomen

## 2019-12-30 MED ORDER — 0.9 % SODIUM CHLORIDE (POUR BTL) OPTIME
TOPICAL | Status: DC | PRN
  Administered 2019-12-30 (×2): 1000 mL

## 2019-12-30 SURGICAL SUPPLY — 88 items
BLADE CLIPPER SURG (BLADE) ×2 IMPLANT
BLADE SAW STERNAL (BLADE) ×1 IMPLANT
BLADE STERNUM SYSTEM 6 (BLADE) ×2 IMPLANT
BLADE SURG 10 STRL SS (BLADE) IMPLANT
CLIP VESOCCLUDE MED 24/CT (CLIP) ×1 IMPLANT
CLIP VESOCCLUDE SM WIDE 24/CT (CLIP) ×1 IMPLANT
CNTNR URN SCR LID CUP LEK RST (MISCELLANEOUS) ×1 IMPLANT
CONT SPEC 4OZ STRL OR WHT (MISCELLANEOUS) ×3
COVER BACK TABLE 60X90IN (DRAPES) IMPLANT
COVER MAYO STAND STRL (DRAPES) IMPLANT
COVER SURGICAL LIGHT HANDLE (MISCELLANEOUS) ×5 IMPLANT
COVER WAND RF STERILE (DRAPES) ×1 IMPLANT
DRAPE HALF SHEET 40X57 (DRAPES) ×2 IMPLANT
DRAPE SLUSH MACHINE 52X66 (DRAPES) ×3 IMPLANT
DRSG COVADERM 4X10 (GAUZE/BANDAGES/DRESSINGS) ×6 IMPLANT
DRSG TELFA 3X8 NADH (GAUZE/BANDAGES/DRESSINGS) ×3 IMPLANT
DURAPREP 26ML APPLICATOR (WOUND CARE) ×2 IMPLANT
ELECT BLADE 6.5 EXT (BLADE) IMPLANT
ELECT REM PT RETURN 9FT ADLT (ELECTROSURGICAL)
ELECTRODE REM PT RTRN 9FT ADLT (ELECTROSURGICAL) ×2 IMPLANT
GAUZE 4X4 16PLY RFD (DISPOSABLE) IMPLANT
GLOVE BIO SURGEON STRL SZ7 (GLOVE) IMPLANT
GLOVE BIO SURGEON STRL SZ7.5 (GLOVE) ×2 IMPLANT
GLOVE BIO SURGEON STRL SZ8 (GLOVE) IMPLANT
GLOVE BIO SURGEON STRL SZ8.5 (GLOVE) IMPLANT
GLOVE BIOGEL PI IND STRL 7.0 (GLOVE) IMPLANT
GLOVE BIOGEL PI IND STRL 7.5 (GLOVE) IMPLANT
GLOVE BIOGEL PI IND STRL 8 (GLOVE) IMPLANT
GLOVE BIOGEL PI IND STRL 8.5 (GLOVE) IMPLANT
GLOVE BIOGEL PI INDICATOR 7.0 (GLOVE)
GLOVE BIOGEL PI INDICATOR 7.5 (GLOVE) ×6
GLOVE BIOGEL PI INDICATOR 8 (GLOVE)
GLOVE BIOGEL PI INDICATOR 8.5 (GLOVE)
GLOVE SURG SS PI 7.0 STRL IVOR (GLOVE) IMPLANT
GLOVE SURG SS PI 7.5 STRL IVOR (GLOVE) ×6 IMPLANT
GLOVE SURG SS PI 8.0 STRL IVOR (GLOVE) IMPLANT
GOWN STRL REUS W/ TWL LRG LVL3 (GOWN DISPOSABLE) ×4 IMPLANT
GOWN STRL REUS W/ TWL XL LVL3 (GOWN DISPOSABLE) ×2 IMPLANT
GOWN STRL REUS W/TWL LRG LVL3 (GOWN DISPOSABLE) ×6
GOWN STRL REUS W/TWL XL LVL3 (GOWN DISPOSABLE) ×6
HANDLE SUCTION POOLE (INSTRUMENTS) IMPLANT
KIT POST MORTEM ADULT 36X90 (BAG) ×3 IMPLANT
KIT TURNOVER KIT B (KITS) ×3 IMPLANT
LOOP VESSEL MAXI BLUE (MISCELLANEOUS) IMPLANT
LOOP VESSEL MINI RED (MISCELLANEOUS) IMPLANT
MANIFOLD NEPTUNE II (INSTRUMENTS) ×3 IMPLANT
NDL BIOPSY 14X6 SOFT TISS (NEEDLE) IMPLANT
NEEDLE BIOPSY 14X6 SOFT TISS (NEEDLE) IMPLANT
NS IRRIG 1000ML POUR BTL (IV SOLUTION) ×4 IMPLANT
PACK AORTA (CUSTOM PROCEDURE TRAY) ×3 IMPLANT
PAD ARMBOARD 7.5X6 YLW CONV (MISCELLANEOUS) ×2 IMPLANT
PAD DRESSING TELFA 3X8 NADH (GAUZE/BANDAGES/DRESSINGS) ×1 IMPLANT
PENCIL BUTTON HOLSTER BLD 10FT (ELECTRODE) ×1 IMPLANT
SOL PREP POV-IOD 4OZ 10% (MISCELLANEOUS) ×2 IMPLANT
SPONGE INTESTINAL PEANUT (DISPOSABLE) IMPLANT
SPONGE LAP 18X18 RF (DISPOSABLE) IMPLANT
STAPLER VISISTAT 35W (STAPLE) ×1 IMPLANT
SUCTION POOLE HANDLE (INSTRUMENTS) ×6
SUT BONE WAX W31G (SUTURE) IMPLANT
SUT CHROMIC 3 0 SH 27 (SUTURE) ×2 IMPLANT
SUT ETHIBOND 5 LR DA (SUTURE) IMPLANT
SUT ETHILON 1 LR 30 (SUTURE) ×14 IMPLANT
SUT ETHILON 2 LR (SUTURE) IMPLANT
SUT PROLENE 3 0 RB 1 (SUTURE) IMPLANT
SUT PROLENE 3 0 SH 1 (SUTURE) IMPLANT
SUT PROLENE 4 0 RB 1 (SUTURE)
SUT PROLENE 4-0 RB1 .5 CRCL 36 (SUTURE) IMPLANT
SUT PROLENE 5 0 C 1 24 (SUTURE) IMPLANT
SUT PROLENE 6 0 BV (SUTURE) IMPLANT
SUT SILK 0 TIES 10X30 (SUTURE) IMPLANT
SUT SILK 1 SH (SUTURE) IMPLANT
SUT SILK 1 TIES 10X30 (SUTURE) IMPLANT
SUT SILK 2 0 (SUTURE)
SUT SILK 2 0 SH (SUTURE) IMPLANT
SUT SILK 2 0 SH CR/8 (SUTURE) ×2 IMPLANT
SUT SILK 2 0 TIES 10X30 (SUTURE) IMPLANT
SUT SILK 2-0 18XBRD TIE 12 (SUTURE) IMPLANT
SUT SILK 3 0 SH CR/8 (SUTURE) IMPLANT
SUT SILK 3 0 TIES 10X30 (SUTURE) IMPLANT
SWAB COLLECTION DEVICE MRSA (MISCELLANEOUS) IMPLANT
SWAB CULTURE ESWAB REG 1ML (MISCELLANEOUS) IMPLANT
SYR 50ML LL SCALE MARK (SYRINGE) IMPLANT
SYRINGE TOOMEY DISP (SYRINGE) IMPLANT
TAPE UMBILICAL 1/8 X36 TWILL (MISCELLANEOUS) IMPLANT
TUBE CONNECTING 12'X1/4 (SUCTIONS) ×1
TUBE CONNECTING 12X1/4 (SUCTIONS) ×2 IMPLANT
WATER STERILE IRR 1000ML POUR (IV SOLUTION) IMPLANT
YANKAUER SUCT BULB TIP NO VENT (SUCTIONS) ×1 IMPLANT

## 2019-12-31 LAB — CULTURE, RESPIRATORY W GRAM STAIN

## 2019-12-31 LAB — SURGICAL PATHOLOGY

## 2020-01-01 LAB — TYPE AND SCREEN
ABO/RH(D): O POS
Antibody Screen: NEGATIVE
Unit division: 0
Unit division: 0
Unit division: 0
Unit division: 0

## 2020-01-01 LAB — BPAM RBC
Blood Product Expiration Date: 202104022359
Blood Product Expiration Date: 202104032359
Blood Product Expiration Date: 202104032359
Blood Product Expiration Date: 202104032359
Unit Type and Rh: 5100
Unit Type and Rh: 5100
Unit Type and Rh: 5100
Unit Type and Rh: 5100

## 2020-01-02 LAB — CULTURE, BLOOD (ROUTINE X 2)
Culture: NO GROWTH
Culture: NO GROWTH
Special Requests: ADEQUATE

## 2020-01-04 DIAGNOSIS — G931 Anoxic brain damage, not elsewhere classified: Secondary | ICD-10-CM

## 2020-01-28 NOTE — OR Nursing (Signed)
1423: Right Kidney out. 0515: Left Kidney out.

## 2020-01-28 NOTE — Death Summary Note (Signed)
DEATH SUMMARY   Patient Details  Name: Nathaniel Casey MRN: [id number] DOB: 1966/09/17  Admission/Discharge Information   Admit Date:  12/10/2019  Date of Death: Date of Death: 2020-06-06  Time of Death: Time of Death: 0440  Length of Stay: 11  Referring Physician: No primary care provider on file.   Reason(s) for Hospitalization  Cardiac arrest  Diagnoses  Preliminary cause of death:  Secondary Diagnoses (including complications and co-morbidities):  Active Problems:   Cardiac arrest (HCC)   Acute respiratory failure (HCC)   Acute encephalopathy   Anoxic brain injury [facility](HCC)  Brief Hospital Course (including significant findings, care, treatment, and services provided and events leading to death)  Nathaniel PettyCalvin F Lesiak is a 54 y.o. year old male who per family had history of asthma who presented with unresponsiveness after being found down and in PEA arrest. During the course of his hospitalization he had myoclonic jerking secondary to severe anoxic brain injury confirmed on MRI brain. He required mechanical ventilation for respiratory support until family meeting was held to discuss withdrawing care. Family made the decision to donate his organs with [facility]. He expired on 01/21/2020 at 17:27.  Pertinent Labs and Studies  Significant Diagnostic Studies CT ABDOMEN PELVIS WO CONTRAST  Result Date: 12/28/2019 CLINICAL DATA:  Cardiac transplant donor assessment EXAM: CT CHEST, ABDOMEN AND PELVIS WITHOUT CONTRAST TECHNIQUE: Multidetector CT imaging of the chest, abdomen and pelvis was performed following the standard protocol without IV contrast. COMPARISON:  Chest radiograph 12/20/2019 FINDINGS: CT CHEST FINDINGS Cardiovascular: Normal cardiac size. Trace pericardial fluid is likely within physiologic normal. Coronary artery calcification is visualized in the right coronary and left anterior descending arteries. Mild calcific plaque noted in the aortic arch with shared origin of  the brachiocephalic and left common carotid artery. Central pulmonary arteries appear to be normal caliber. Luminal evaluation is precluded in the absence of contrast. No gross venous abnormality seen in the chest. Mediastinum/Nodes: Endotracheal tube terminates in mid to lower trachea, 2.6 cm from the carina. A transesophageal tube tip is curled within the gastric lumen. No acute abnormality of the trachea or esophagus. No mediastinal or axillary adenopathy. Hilar lymph nodes difficult to assess in the absence of contrast media. Thyroid gland and thoracic inlet are unremarkable. Lungs/Pleura: There are dependent atelectatic changes bilaterally with patchy consolidative airspace opacity in the superior segment right lower lobe which could reflect some superimposed infection or inflammation. No pneumothorax or visible effusion. Musculoskeletal: No acute osseous abnormality or suspicious osseous lesion. CT ABDOMEN PELVIS FINDINGS Hepatobiliary: No focal liver abnormality is seen. Maximum AP by ML x CC dimensions measure 14.2 x 20.8 x 18.5 cm. No gallstones, gallbladder wall thickening, or biliary dilatation. Pancreas: Unremarkable. No pancreatic ductal dilatation or surrounding inflammatory changes. Spleen: Normal in size without focal abnormality. Adrenals/Urinary Tract: Normal adrenal glands. Mild nonspecific bilateral perinephric stranding, can be seen with senescent change, diminished renal function, or infection. No visible or contour deforming renal masses. No urolithiasis or hydronephrosis. Urinary bladder is decompressed about an inflated Foley catheter balloon. Mild bladder wall thickening is nonspecific given underdistention. Stomach/Bowel: Transesophageal tube curling in the stomach, as above. The stomach and duodenal sweep are otherwise unremarkable. No small bowel wall thickening or dilatation. No evidence of obstruction. A normal appendix is visualized. No colonic dilatation or wall thickening.  Vascular/Lymphatic: Minimal atherosclerotic plaque in the abdominal aorta. No suspicious or enlarged lymph nodes in the included lymphatic chains. Reproductive: The prostate and seminal vesicles are unremarkable. Other: No abdominopelvic  free fluid or free gas. No bowel containing hernias. Small fat containing bilateral inguinal hernias. Small fat containing bilateral inguinal hernias. Mild soft tissue edema the lateral hips, flanks and posterior midline, likely to correlate with hydration status. Musculoskeletal: No acute osseous abnormality or suspicious osseous lesion. IMPRESSION: 1. Dependent atelectatic changes bilaterally with patchy consolidative airspace opacity in the superior segment right lower lobe which could reflect some superimposed infection or inflammation such as sequela from aspiration. 2. Mild nonspecific bilateral perinephric stranding, can be seen with senescent change, diminished renal function, or infection. Recommend correlation with urinalysis. 3. No gross hepatic abnormality, measurements as above. 4. Endotracheal tube in the low trachea, 2.6 cm from the carina. Transesophageal tube appropriately positioned in the gastric lumen. 5. Coronary artery atherosclerosis. 6.  Aortic Atherosclerosis (ICD10-I70.0). 7. Mild soft tissue edema the lateral hips, flanks and posterior midline, likely to correlate with hydration status. Electronically Signed   By: Kreg Shropshire M.D.   On: 12/28/2019 23:29   CT HEAD WO CONTRAST  Result Date: 03-Jan-2020 CLINICAL DATA:  Cardiac arrest. EXAM: CT HEAD WITHOUT CONTRAST TECHNIQUE: Contiguous axial images were obtained from the base of the skull through the vertex without intravenous contrast. COMPARISON:  None. FINDINGS: Brain: No intracranial hemorrhage, mass effect, or midline shift. Gray-white differentiation is preserved. No evidence of cerebral edema. No hydrocephalus. The basilar cisterns are patent. No evidence of territorial infarct or acute ischemia.  No extra-axial or intracranial fluid collection. Vascular: Atherosclerosis of skullbase vasculature without hyperdense vessel or abnormal calcification. Skull: No fracture or focal lesion. Sinuses/Orbits: Small fluid level in the sphenoid and maxillary sinus and ethmoid air cell mucosal thickening may be related to intubation. Minimal opacification of lower right mastoid air cells without significant effusion. Orbits are unremarkable. Other: None. IMPRESSION: No acute intracranial abnormality. Electronically Signed   By: Narda Rutherford M.D.   On: 01/03/2020 20:28   CT CHEST WO CONTRAST  Result Date: 12/28/2019 CLINICAL DATA:  Cardiac transplant donor assessment EXAM: CT CHEST, ABDOMEN AND PELVIS WITHOUT CONTRAST TECHNIQUE: Multidetector CT imaging of the chest, abdomen and pelvis was performed following the standard protocol without IV contrast. COMPARISON:  Chest radiograph 12/20/2019 FINDINGS: CT CHEST FINDINGS Cardiovascular: Normal cardiac size. Trace pericardial fluid is likely within physiologic normal. Coronary artery calcification is visualized in the right coronary and left anterior descending arteries. Mild calcific plaque noted in the aortic arch with shared origin of the brachiocephalic and left common carotid artery. Central pulmonary arteries appear to be normal caliber. Luminal evaluation is precluded in the absence of contrast. No gross venous abnormality seen in the chest. Mediastinum/Nodes: Endotracheal tube terminates in mid to lower trachea, 2.6 cm from the carina. A transesophageal tube tip is curled within the gastric lumen. No acute abnormality of the trachea or esophagus. No mediastinal or axillary adenopathy. Hilar lymph nodes difficult to assess in the absence of contrast media. Thyroid gland and thoracic inlet are unremarkable. Lungs/Pleura: There are dependent atelectatic changes bilaterally with patchy consolidative airspace opacity in the superior segment right lower lobe which  could reflect some superimposed infection or inflammation. No pneumothorax or visible effusion. Musculoskeletal: No acute osseous abnormality or suspicious osseous lesion. CT ABDOMEN PELVIS FINDINGS Hepatobiliary: No focal liver abnormality is seen. Maximum AP by ML x CC dimensions measure 14.2 x 20.8 x 18.5 cm. No gallstones, gallbladder wall thickening, or biliary dilatation. Pancreas: Unremarkable. No pancreatic ductal dilatation or surrounding inflammatory changes. Spleen: Normal in size without focal abnormality. Adrenals/Urinary Tract: Normal adrenal glands. Mild  nonspecific bilateral perinephric stranding, can be seen with senescent change, diminished renal function, or infection. No visible or contour deforming renal masses. No urolithiasis or hydronephrosis. Urinary bladder is decompressed about an inflated Foley catheter balloon. Mild bladder wall thickening is nonspecific given underdistention. Stomach/Bowel: Transesophageal tube curling in the stomach, as above. The stomach and duodenal sweep are otherwise unremarkable. No small bowel wall thickening or dilatation. No evidence of obstruction. A normal appendix is visualized. No colonic dilatation or wall thickening. Vascular/Lymphatic: Minimal atherosclerotic plaque in the abdominal aorta. No suspicious or enlarged lymph nodes in the included lymphatic chains. Reproductive: The prostate and seminal vesicles are unremarkable. Other: No abdominopelvic free fluid or free gas. No bowel containing hernias. Small fat containing bilateral inguinal hernias. Small fat containing bilateral inguinal hernias. Mild soft tissue edema the lateral hips, flanks and posterior midline, likely to correlate with hydration status. Musculoskeletal: No acute osseous abnormality or suspicious osseous lesion. IMPRESSION: 1. Dependent atelectatic changes bilaterally with patchy consolidative airspace opacity in the superior segment right lower lobe which could reflect some  superimposed infection or inflammation such as sequela from aspiration. 2. Mild nonspecific bilateral perinephric stranding, can be seen with senescent change, diminished renal function, or infection. Recommend correlation with urinalysis. 3. No gross hepatic abnormality, measurements as above. 4. Endotracheal tube in the low trachea, 2.6 cm from the carina. Transesophageal tube appropriately positioned in the gastric lumen. 5. Coronary artery atherosclerosis. 6.  Aortic Atherosclerosis (ICD10-I70.0). 7. Mild soft tissue edema the lateral hips, flanks and posterior midline, likely to correlate with hydration status. Electronically Signed   By: Kreg Shropshire M.D.   On: 12/28/2019 23:29   MR BRAIN WO CONTRAST  Result Date: 12/23/2019 CLINICAL DATA:  Stroke follow-up. Cardiac arrest. EXAM: MRI HEAD WITHOUT CONTRAST TECHNIQUE: Multiplanar, multiecho pulse sequences of the brain and surrounding structures were obtained without intravenous contrast. COMPARISON:  Head CT 2019-12-22 FINDINGS: Brain: There is widespread, symmetric cortically based restricted diffusion throughout both cerebral hemispheres. Symmetric diffusion signal abnormality is also noted in the basal ganglia and to a lesser extent thalami. There is associated T2/FLAIR hyperintensity with only at most mild gyral swelling. Abnormal T2 hyperintensity is also present throughout the cerebellum. No intracranial hemorrhage, mass, midline shift, tonsillar herniation, or extra-axial fluid collection is identified. Vascular: Major intracranial vascular flow voids are preserved. Skull and upper cervical spine: Unremarkable bone marrow signal. Sinuses/Orbits: Unremarkable orbits. Mild mucosal thickening in the paranasal sinuses. Small bilateral mastoid effusions. Other: None. IMPRESSION: Widespread cerebral and cerebellar edema consistent with hypoxic-ischemic injury. No herniation or hemorrhage. Electronically Signed   By: Sebastian Ache M.D.   On: 12/23/2019 17:45    DG CHEST PORT 1 VIEW  Result Date: 12/29/2019 CLINICAL DATA:  Cardiac transplant donor assessment EXAM: PORTABLE CHEST 1 VIEW COMPARISON:  December 20, 2019 FINDINGS: The heart size and mediastinal contours are within normal limits. ET tube is 3 cm above the carina. NG tube is seen coiled in the proximal stomach. No large airspace consolidation or pleural effusion. No acute osseous abnormality. IMPRESSION: ET tube and NG tube in satisfactory position. No acute cardiopulmonary process. Electronically Signed   By: Jonna Clark M.D.   On: 12/29/2019 06:41   DG Chest Port 1 View  Result Date: 12/20/2019 CLINICAL DATA:  Intubated EXAM: PORTABLE CHEST 1 VIEW COMPARISON:  Chest radiograph from one day prior. FINDINGS: Endotracheal tube tip is 3.5 cm above the carina. Enteric tube enters stomach with the tip not seen on this image. Stable cardiomediastinal silhouette  with top-normal heart size. No pneumothorax. No pleural effusion. Hazy upper parahilar opacities in the right greater than left lungs have nearly resolved. IMPRESSION: 1. Well-positioned endotracheal and enteric tubes. 2. Nearly resolved hazy upper parahilar opacities in both lungs, favoring resolving edema. Electronically Signed   By: Delbert Phenix M.D.   On: 12/20/2019 06:59   DG Chest Port 1 View  Result Date: 12/06/2019 CLINICAL DATA:  54 year old male with shortness of breath EXAM: PORTABLE CHEST 1 VIEW COMPARISON:  Chest radiograph dated 02/17/2013. FINDINGS: Endotracheal tube with tip approximately 3 cm above the carina. Enteric tube extends below the diaphragm with tip beyond the inferior margin of the image. Bilateral upper lobe opacities most consistent with pneumonia. No pleural effusion or pneumothorax. Stable cardiac silhouette. Soft tissue fullness of the upper mediastinum new since the prior radiograph, possibly adenopathy. No acute osseous pathology. IMPRESSION: 1. Multifocal pneumonia involving the upper lobes. Clinical correlation  and follow-up to resolution recommended. 2. Soft tissue prominence of the upper mediastinum, likely adenopathy. Electronically Signed   By: Elgie Collard M.D.   On: 12/08/2019 17:20   Overnight EEG with video  Result Date: 12/21/2019 Charlsie Quest, MD     12/22/2019 10:44 AM Patient Name: TAJUAN DUFAULT MRN: [id number] Epilepsy Attending: Charlsie Quest Referring Physician/Provider: Dr Ritta Slot Duration: 12/20/2019 1208 to 12/21/2019 1208 Patient history: 54 year old male with likely diffuse anoxic injury, overnight had movements myoclonus versus extensor posturing concerning for seizure. EEG to evaluate for seizure. Level of alertness: comatose AEDs during EEG study: propofol Technical aspects: This EEG study was done with scalp electrodes positioned according to the 10-20 International system of electrode placement. Electrical activity was acquired at a sampling rate of  and reviewed with a high frequency filter of  and a low frequency filter of . EEG data were recorded continuously and digitally stored. DESCRIPTION: EEG showed continuous generalized low amplitude 5-8hz  theta-alpha slowing. Intermittent 2-3Hz  generalized delta slowing was also noted. Gradually, eeg showed generalized 8-9Hz  alpha activity. Reactivity was not tested during this study. Hyperventilation and photic stimulation were not performed. ABNORMALITY - Continuous slow, generalized - Intermittent slow, generalized IMPRESSION: This study is suggestive of profound diffuse encephalopathy, non specific to etiology but could be secondary to sedation, hypoxic/anoxic injury. No seizures or epileptiform discharges were seen throughout the recording. Charlsie Quest   ECHOCARDIOGRAM COMPLETE  Result Date: 12/20/2019    ECHOCARDIOGRAM REPORT   Patient Name:   IDRIS EDMUNDSON Date of Exam: 12/20/2019 Medical Rec #:  [id number]         Height:       67.0 in Accession #:    [id number]        Weight:       151.7 lb  Date of Birth:  1966-06-30         BSA:          1.798 m Patient Age:    54 years          BP:           91/66 mmHg Patient Gender: M                 HR:           83 bpm. Exam Location:  Inpatient Procedure: 2D Echo, Cardiac Doppler and Color Doppler Indications:    Cardiac arrest I46.9  History:        Patient has no prior history of Echocardiogram examinations.  Sonographer:    Fleet Contras  Maurice March RDCS Referring Phys: [id number] LAURA R GLEASON IMPRESSIONS  1. Left ventricular ejection fraction, by estimation, is 45 to 50%. The left ventricle has mildly decreased function. The left ventricle demonstrates regional wall motion abnormalities (see scoring diagram/findings for description). There is mild left ventricular hypertrophy. Left ventricular diastolic parameters were normal.  2. Right ventricular systolic function is normal. The right ventricular size is normal. There is normal pulmonary artery systolic pressure. The estimated right ventricular systolic pressure is 19.6 mmHg.  3. The mitral valve is grossly normal. Trivial mitral valve regurgitation.  4. The aortic valve is tricuspid. Aortic valve regurgitation is not visualized.  5. The inferior vena cava is normal in size with <50% respiratory variability, suggesting right atrial pressure of 8 mmHg. FINDINGS  Left Ventricle: Left ventricular ejection fraction, by estimation, is 45 to 50%. The left ventricle has mildly decreased function. The left ventricle demonstrates regional wall motion abnormalities. The left ventricular internal cavity size was normal in size. There is mild left ventricular hypertrophy. Left ventricular diastolic parameters were normal.  LV Wall Scoring: The mid and distal anterior septum, mid inferolateral segment, and mid inferoseptal segment are hypokinetic. The entire anterior wall, antero-lateral wall, entire inferior wall, basal anteroseptal segment, basal inferolateral segment, apical lateral segment, basal inferoseptal segment, and apex  are normal. Right Ventricle: The right ventricular size is normal. No increase in right ventricular wall thickness. Right ventricular systolic function is normal. There is normal pulmonary artery systolic pressure. The tricuspid regurgitant velocity is 1.70 m/s, and  with an assumed right atrial pressure of 8 mmHg, the estimated right ventricular systolic pressure is 19.6 mmHg. Left Atrium: Left atrial size was normal in size. Right Atrium: Right atrial size was normal in size. Pericardium: There is no evidence of pericardial effusion. Mitral Valve: The mitral valve is grossly normal. Trivial mitral valve regurgitation. Tricuspid Valve: The tricuspid valve is grossly normal. Tricuspid valve regurgitation is mild. Aortic Valve: The aortic valve is tricuspid. Aortic valve regurgitation is not visualized. Mild aortic valve annular calcification. Pulmonic Valve: The pulmonic valve was grossly normal. Pulmonic valve regurgitation is not visualized. Aorta: The aortic root is normal in size and structure. Venous: The inferior vena cava is normal in size with less than 50% respiratory variability, suggesting right atrial pressure of 8 mmHg. IAS/Shunts: There is right bowing of the interatrial septum, suggestive of elevated left atrial pressure. No atrial level shunt detected by color flow Doppler.  LEFT VENTRICLE PLAX 2D LVIDd:         4.30 cm     Diastology LVIDs:         3.60 cm     LV e' lateral:   12.00 cm/s LV PW:         0.95 cm     LV E/e' lateral: 5.7 LV IVS:        1.05 cm     LV e' medial:    7.72 cm/s LVOT diam:     2.30 cm     LV E/e' medial:  8.9 LV SV:         55.67 ml LV SV Index:   30.96 LVOT Area:     4.15 cm  LV Volumes (MOD) LV vol d, MOD A2C: 61.8 ml LV vol d, MOD A4C: 63.0 ml LV vol s, MOD A2C: 34.4 ml LV vol s, MOD A4C: 31.0 ml LV SV MOD A2C:     27.4 ml LV SV MOD A4C:     63.0 ml  LV SV MOD BP:      31.3 ml RIGHT VENTRICLE          IVC RV Basal diam:  4.30 cm  IVC diam: 1.80 cm RV Mid diam:    3.50 cm  LEFT ATRIUM             Index       RIGHT ATRIUM           Index LA diam:        2.40 cm 1.33 cm/m  RA Area:     10.50 cm LA Vol (A2C):   23.3 ml 12.96 ml/m RA Volume:   25.40 ml  14.13 ml/m LA Vol (A4C):   13.5 ml 7.51 ml/m LA Biplane Vol: 19.1 ml 10.62 ml/m  AORTIC VALVE LVOT Vmax:   84.90 cm/s LVOT Vmean:  52.200 cm/s LVOT VTI:    0.134 m  AORTA Ao Root diam: 3.40 cm Ao Asc diam:  2.60 cm MITRAL VALVE               TRICUSPID VALVE MV Area (PHT): 3.53 cm    TR Peak grad:   11.6 mmHg MV Decel Time: 215 msec    TR Vmax:        170.00 cm/s MV E velocity: 68.60 cm/s MV A velocity: 65.60 cm/s  SHUNTS MV E/A ratio:  1.05        Systemic VTI:  0.13 m                            Systemic Diam: 2.30 cm Nona Dell MD Electronically signed by Nona Dell MD Signature Date/Time: 12/20/2019/12:23:36 PM    Final    [place] EKG SITE RITE  Result Date: 12/28/2019 If Site Rite image not attached, placement could not be confirmed due to current cardiac rhythm.   Microbiology Recent Results (from the past 240 hour(s))  Culture, blood (routine x 2)     Status: None   Collection Time: 12/28/19  6:16 PM   Specimen: BLOOD RIGHT HAND  Result Value Ref Range Status   Specimen Description BLOOD RIGHT HAND  Final   Special Requests   Final    BOTTLES DRAWN AEROBIC ONLY Blood Culture adequate volume   Culture   Final    NO GROWTH 5 DAYS Performed at [facility], [address] [place], [place] [address]    Report Status 01/02/2020 FINAL  Final  Culture, Urine     Status: None   Collection Time: 12/28/19  8:10 PM   Specimen: Urine, Random  Result Value Ref Range Status   Specimen Description URINE, RANDOM  Final   Special Requests NONE  Final   Culture   Final    NO GROWTH Performed at [facility], [address] [place], [place] [address]    Report Status 01/14/2020 FINAL  Final  Culture, blood (routine x 2)     Status: None   Collection Time: 12/28/19  8:20 PM   Specimen: BLOOD   Result Value Ref Range Status   Specimen Description BLOOD LEFT A-LINE  Final   Special Requests   Final    BOTTLES DRAWN AEROBIC AND ANAEROBIC Blood Culture results may not be optimal due to an excessive volume of blood received in culture bottles   Culture   Final    NO GROWTH 5 DAYS Performed at [facility], [address], [place],  [place] [address]    Report Status 01/02/2020 FINAL  Final  Respiratory Panel by RT PCR (Flu A&B, Covid) - Nasopharyngeal Swab     Status: None   Collection Time: 12/28/19  9:20 PM   Specimen: Nasopharyngeal Swab  Result Value Ref Range Status   SARS Coronavirus 2 by RT PCR NEGATIVE NEGATIVE Final    Comment: (NOTE) SARS-CoV-2 target nucleic acids are NOT DETECTED. The SARS-CoV-2 RNA is generally detectable in upper respiratoy specimens during the acute phase of infection. The lowest concentration of SARS-CoV-2 viral copies this assay can detect is 131 copies/mL. A negative result does not preclude SARS-Cov-2 infection and should not be used as the sole basis for treatment or other patient management decisions. A negative result may occur with  improper specimen collection/handling, submission of specimen other than nasopharyngeal swab, presence of viral mutation(s) within the areas targeted by this assay, and inadequate number of viral copies (<131 copies/mL). A negative result must be combined with clinical observations, patient history, and epidemiological information. The expected result is Negative. Fact Sheet for Patients:  PinkCheek.be Fact Sheet for Healthcare Providers:  GravelBags.it This test is not yet ap proved or cleared by the [place] FDA and  has been authorized for detection and/or diagnosis of SARS-CoV-2 by FDA under an Emergency Use Authorization (EUA). This EUA will remain  in effect (meaning this test can be used) for the duration of the COVID-19 declaration  under Section 564(b)(1) of the Act, 21 U.S.C. section 360bbb-3(b)(1), unless the authorization is terminated or revoked sooner.    Influenza A by PCR NEGATIVE NEGATIVE Final   Influenza B by PCR NEGATIVE NEGATIVE Final    Comment: (NOTE) The Xpert Xpress SARS-CoV-2/FLU/RSV assay is intended as an aid in  the diagnosis of influenza from Nasopharyngeal swab specimens and  should not be used as a sole basis for treatment. Nasal washings and  aspirates are unacceptable for Xpert Xpress SARS-CoV-2/FLU/RSV  testing. Fact Sheet for Patients: PinkCheek.be Fact Sheet for Healthcare Providers: GravelBags.it This test is not yet approved or cleared by the [place] FDA and  has been authorized for detection and/or diagnosis of SARS-CoV-2 by  FDA under an Emergency Use Authorization (EUA). This EUA will remain  in effect (meaning this test can be used) for the duration of the  Covid-19 declaration under Section 564(b)(1) of the Act, 21  U.S.C. section 360bbb-3(b)(1), unless the authorization is  terminated or revoked. Performed at [facility], [place] [address], [place], [place] [address]   Culture, respiratory (non-expectorated)     Status: None   Collection Time: 12/29/19 11:33 AM   Specimen: Tracheal Aspirate; Respiratory  Result Value Ref Range Status   Specimen Description TRACHEAL ASPIRATE  Final   Special Requests NONE  Final   Gram Stain   Final    RARE WBC PRESENT, PREDOMINANTLY MONONUCLEAR FEW GRAM POSITIVE RODS RARE GRAM NEGATIVE RODS RARE SQUAMOUS EPITHELIAL CELLS PRESENT Performed at [facility], [place]. [address], [place], [place] [address]    Culture MODERATE STENOTROPHOMONAS MALTOPHILIA  Final   Report Status 12/31/2019 FINAL  Final   Organism ID, Bacteria STENOTROPHOMONAS MALTOPHILIA  Final      Susceptibility   Stenotrophomonas maltophilia - MIC*    LEVOFLOXACIN 0.25 SENSITIVE Sensitive      TRIMETH/SULFA 80 RESISTANT Resistant     * MODERATE STENOTROPHOMONAS MALTOPHILIA    Lab Basic Metabolic Panel: Recent Labs  Lab 12/28/19 2024 12/29/19 0346 12/29/19 0511 12/29/19 0615 12/29/19 1308  NA 137 136 137 136 136  K 4.3 3.7 3.9 3.9 4.1  CL 106  --  106  --  105  CO2 23  --  23  --  21*  GLUCOSE 123*  --  126*  --  118*  BUN 25*  --  23*  --  22*  CREATININE 0.72  --  0.77  --  0.69  CALCIUM 8.3*  --  8.1*  --  8.1*  MG 2.1  --   --   --   --   PHOS 4.3  --   --   --   --    Liver Function Tests: Recent Labs  Lab 12/28/19 2024 12/29/19 0511 12/29/19 1308  AST 147* 121* 128*  ALT 282* 249* 270*  ALKPHOS 79 72 70  BILITOT 0.8 0.5 0.5  PROT 5.5* 5.4* 5.4*  ALBUMIN 2.5* 2.2* 2.2*   No results for input(s): LIPASE, AMYLASE in the last 168 hours. No results for input(s): AMMONIA in the last 168 hours. CBC: Recent Labs  Lab 12/28/19 2024 12/29/19 0346 12/29/19 0511 12/29/19 0615  WBC 17.5*  --  17.3*  --   NEUTROABS 12.6*  --  13.3*  --   HGB 11.9* 10.5* 10.6* 11.2*  HCT 36.1* 31.0* 32.0* 33.0*  MCV 90.0  --  89.4  --   PLT 273  --  267  --    Cardiac Enzymes: No results for input(s): CKTOTAL, CKMB, CKMBINDEX, TROPONINI in the last 168 hours. Sepsis Labs: Recent Labs  Lab 12/28/19 2024 12/29/19 0511  WBC 17.5* 17.3*    Procedures/Operations  ETT 2/20  Mehar Sagen Mechele Collin 01/04/2020, 8:15 PM

## 2020-01-28 NOTE — Progress Notes (Addendum)
Patient in PEA arrest at 0435, Two RN's verified time of death at 64 with 1 minute auscultation. Verified by Burnett Harry, RN and Darlis Loan, RN.   Noe Gens, RN

## 2020-01-28 NOTE — Progress Notes (Signed)
Wasted 70 ml of morphine with Darlis Loan, RN. Wasted in liquid disposal container.   Noe Gens, RN

## 2020-01-28 NOTE — Progress Notes (Signed)
Patient transported to OR with CDS and RT for organ donation.   Noe Gens, RN

## 2020-01-28 NOTE — OR Nursing (Signed)
4496: Pulseless Electrical Activity verified by Alesia Richards, ICU RN, Darlis Loan, ICU RN and Jomarie Longs, [facility] Donor Services Coordinator. 0440: Time of Death.

## 2020-01-28 NOTE — OR Nursing (Signed)
0445: Cross clamp.

## 2020-01-28 DEATH — deceased

## 2020-06-22 IMAGING — CT CT ABD-PELV W/O CM
2 of 4 series · 13 of 36 positions shown, 16 images · non-contrast
Comparison: Chest radiograph 12/20/2019

CLINICAL DATA: Cardiac transplant donor assessment

EXAM:
CT CHEST, ABDOMEN AND PELVIS WITHOUT CONTRAST
TECHNIQUE: Multidetector CT imaging of the chest, abdomen and pelvis was
performed following the standard protocol without IV contrast.

[Series 3: cap wo 5.0 i31f 2 · axial · 0.88mm/px · z∈[+1136,+1680]mm · 10 of 129 slices shown, 13 images]
[im 10/129  mediastinal]
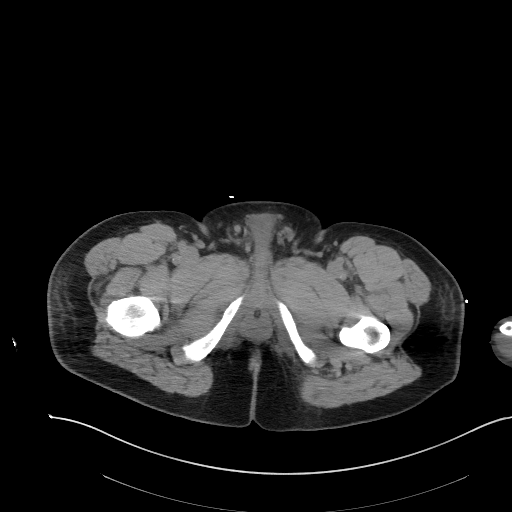
[im 10/129  lung]
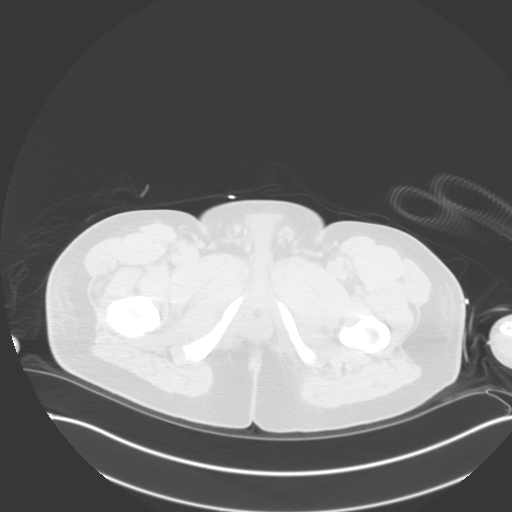
[im 20/129  lung]
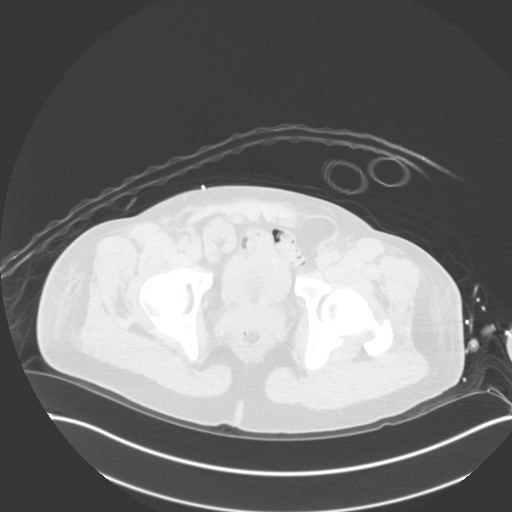
[im 40/129  lung]
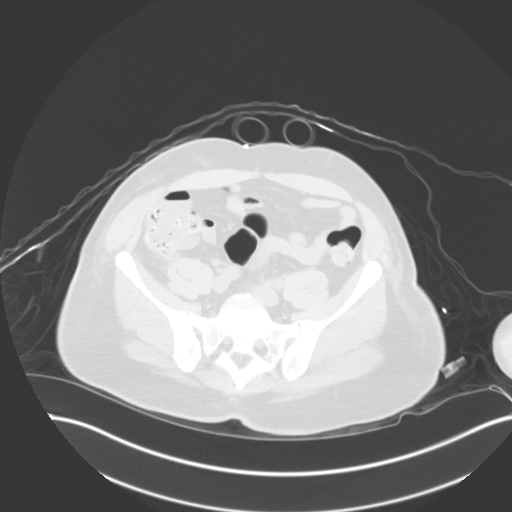
[im 50/129  lung]
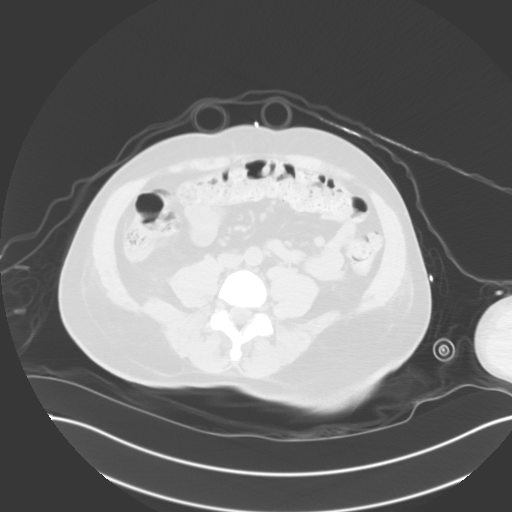
[im 60/129  mediastinal]
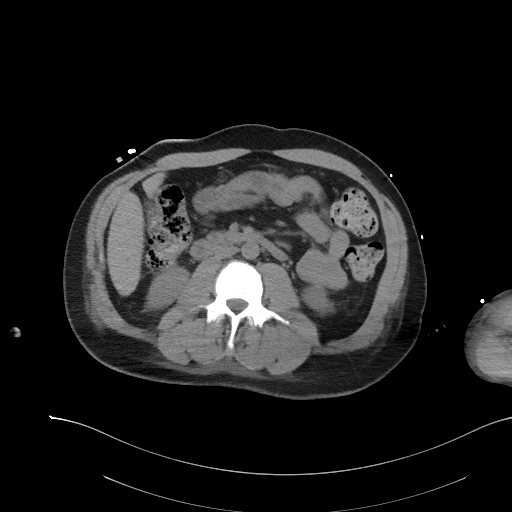
[im 60/129  lung]
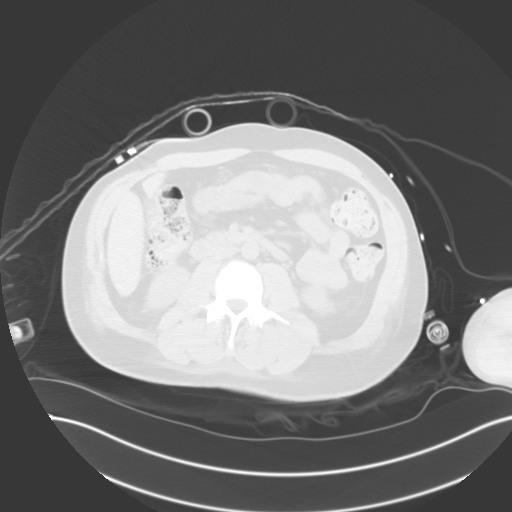
[im 69/129  lung]
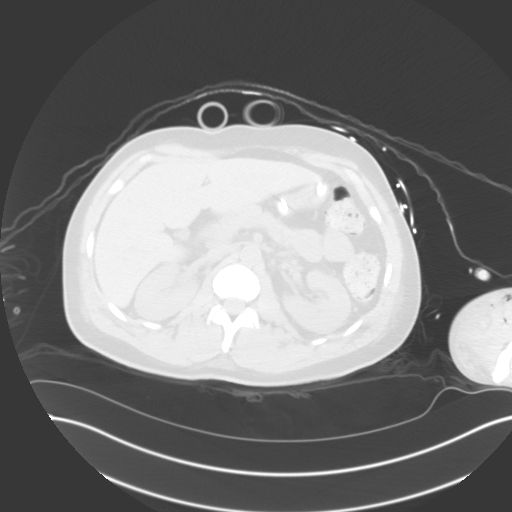
[im 79/129  lung]
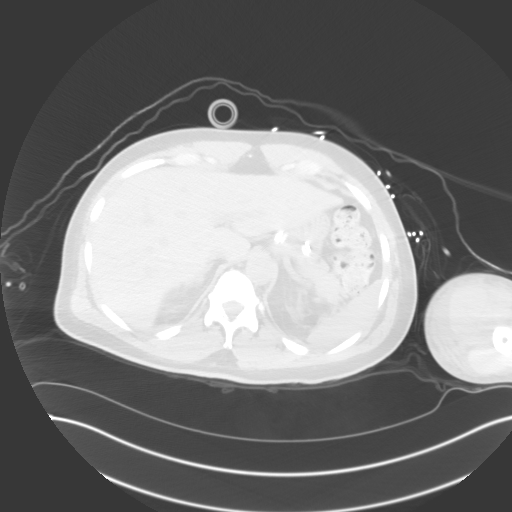
[im 99/129  lung]
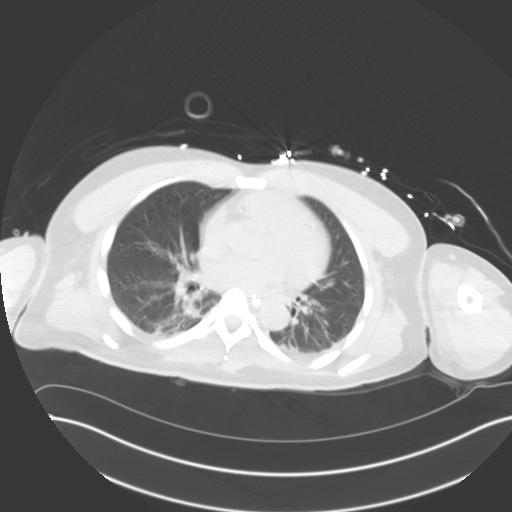
[im 109/129  mediastinal]
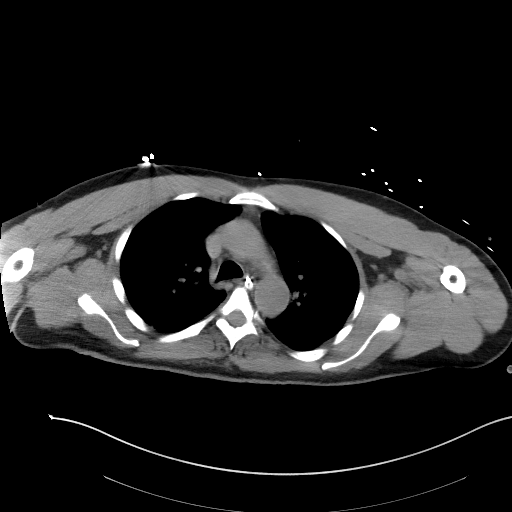
[im 109/129  lung]
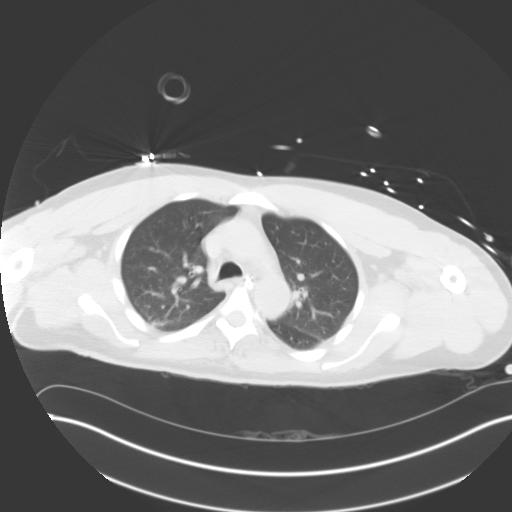
[im 119/129  lung]
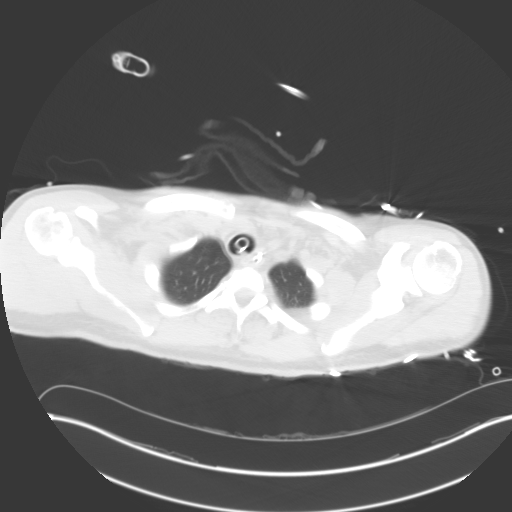

[Series 6: coronal · coronal · 0.88mm/px · 3 of 160 slices shown]
[im 32/160  lung]
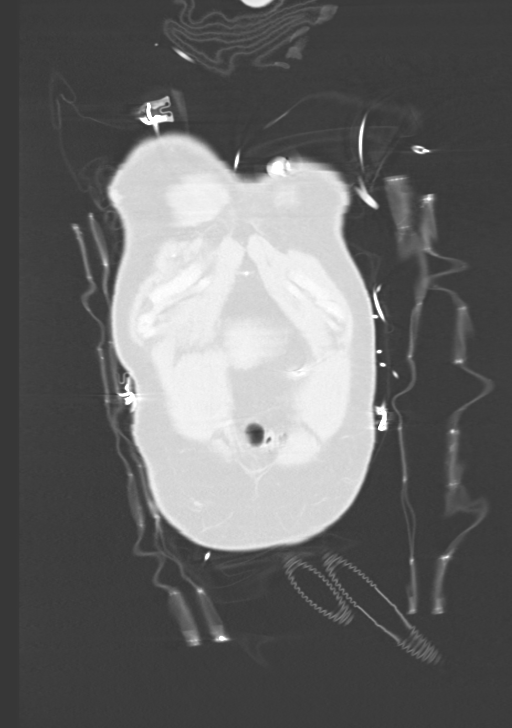
[im 64/160  lung]
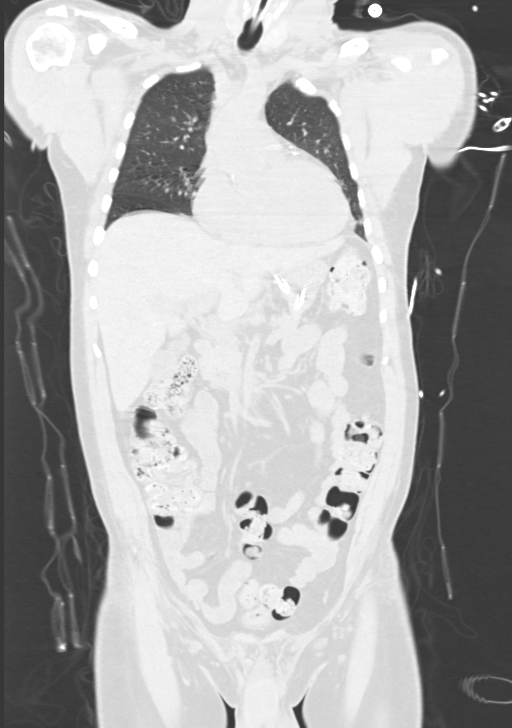
[im 96/160  lung]
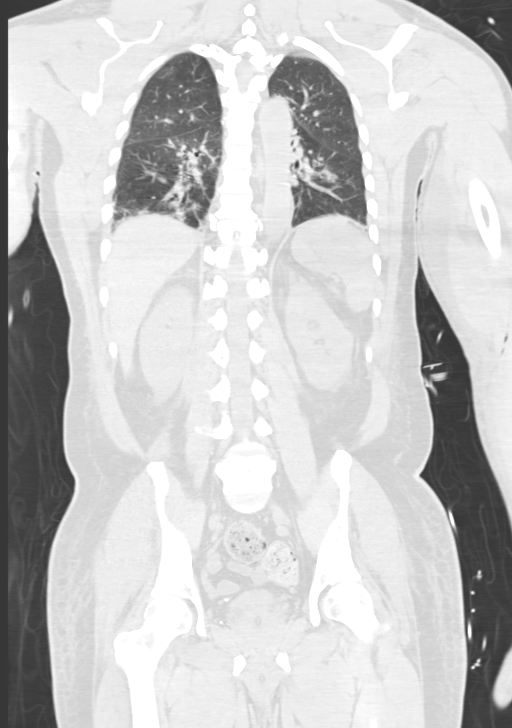

[13 of 36 positions shown; findings below may reference images not displayed]

FINDINGS: CT CHEST FINDINGS

Cardiovascular: Normal cardiac size. Trace pericardial fluid is
likely within physiologic normal. Coronary artery calcification is
visualized in the right coronary and left anterior descending
arteries. Mild calcific plaque noted in the aortic arch with shared
origin of the brachiocephalic and left common carotid artery.
Central pulmonary arteries appear to be normal caliber. Luminal
evaluation is precluded in the absence of contrast. No gross venous
abnormality seen in the chest.

Mediastinum/Nodes: Endotracheal tube terminates in mid to lower
trachea, 2.6 cm from the carina. A transesophageal tube tip is
curled within the gastric lumen. No acute abnormality of the trachea
or esophagus. No mediastinal or axillary adenopathy. Hilar lymph
nodes difficult to assess in the absence of contrast media. Thyroid
gland and thoracic inlet are unremarkable.

Lungs/Pleura: There are dependent atelectatic changes bilaterally
with patchy consolidative airspace opacity in the superior segment
right lower lobe which could reflect some superimposed infection or
inflammation. No pneumothorax or visible effusion.

Musculoskeletal: No acute osseous abnormality or suspicious osseous
lesion.

CT ABDOMEN PELVIS FINDINGS

Hepatobiliary: No focal liver abnormality is seen. Maximum AP by ML
x CC dimensions measure 14.2 x 20.8 x 18.5 cm. No gallstones,
gallbladder wall thickening, or biliary dilatation.

Pancreas: Unremarkable. No pancreatic ductal dilatation or
surrounding inflammatory changes.

Spleen: Normal in size without focal abnormality.

Adrenals/Urinary Tract: Normal adrenal glands. Mild nonspecific
bilateral perinephric stranding, can be seen with senescent change,
diminished renal function, or infection. No visible or contour
deforming renal masses. No urolithiasis or hydronephrosis. Urinary
bladder is decompressed about an inflated Foley catheter balloon.
Mild bladder wall thickening is nonspecific given underdistention.

Stomach/Bowel: Transesophageal tube curling in the stomach, as
above. The stomach and duodenal sweep are otherwise unremarkable. No
small bowel wall thickening or dilatation. No evidence of
obstruction. A normal appendix is visualized. No colonic dilatation
or wall thickening.

Vascular/Lymphatic: Minimal atherosclerotic plaque in the abdominal
aorta. No suspicious or enlarged lymph nodes in the included
lymphatic chains.

Reproductive: The prostate and seminal vesicles are unremarkable.

Other: No abdominopelvic free fluid or free gas. No bowel containing
hernias. Small fat containing bilateral inguinal hernias. Small fat
containing bilateral inguinal hernias. Mild soft tissue edema the
lateral hips, flanks and posterior midline, likely to correlate with
hydration status.

Musculoskeletal: No acute osseous abnormality or suspicious osseous
lesion.
IMPRESSION: 1. Dependent atelectatic changes bilaterally with patchy
consolidative airspace opacity in the superior segment right lower
lobe which could reflect some superimposed infection or inflammation
such as sequela from aspiration.
2. Mild nonspecific bilateral perinephric stranding, can be seen
with senescent change, diminished renal function, or infection.
Recommend correlation with urinalysis.
3. No gross hepatic abnormality, measurements as above.
4. Endotracheal tube in the low trachea, 2.6 cm from the carina.
Transesophageal tube appropriately positioned in the gastric lumen.
5. Coronary artery atherosclerosis.
6.  Aortic Atherosclerosis ([id number]).
7. Mild soft tissue edema the lateral hips, flanks and posterior
midline, likely to correlate with hydration status.

## 2020-06-23 IMAGING — DX DG CHEST 1V PORT
1 series · 1 of 1 positions shown · non-contrast
Comparison: December 20, 2019

CLINICAL DATA: Cardiac transplant donor assessment

EXAM:
PORTABLE CHEST 1 VIEW

[chest]
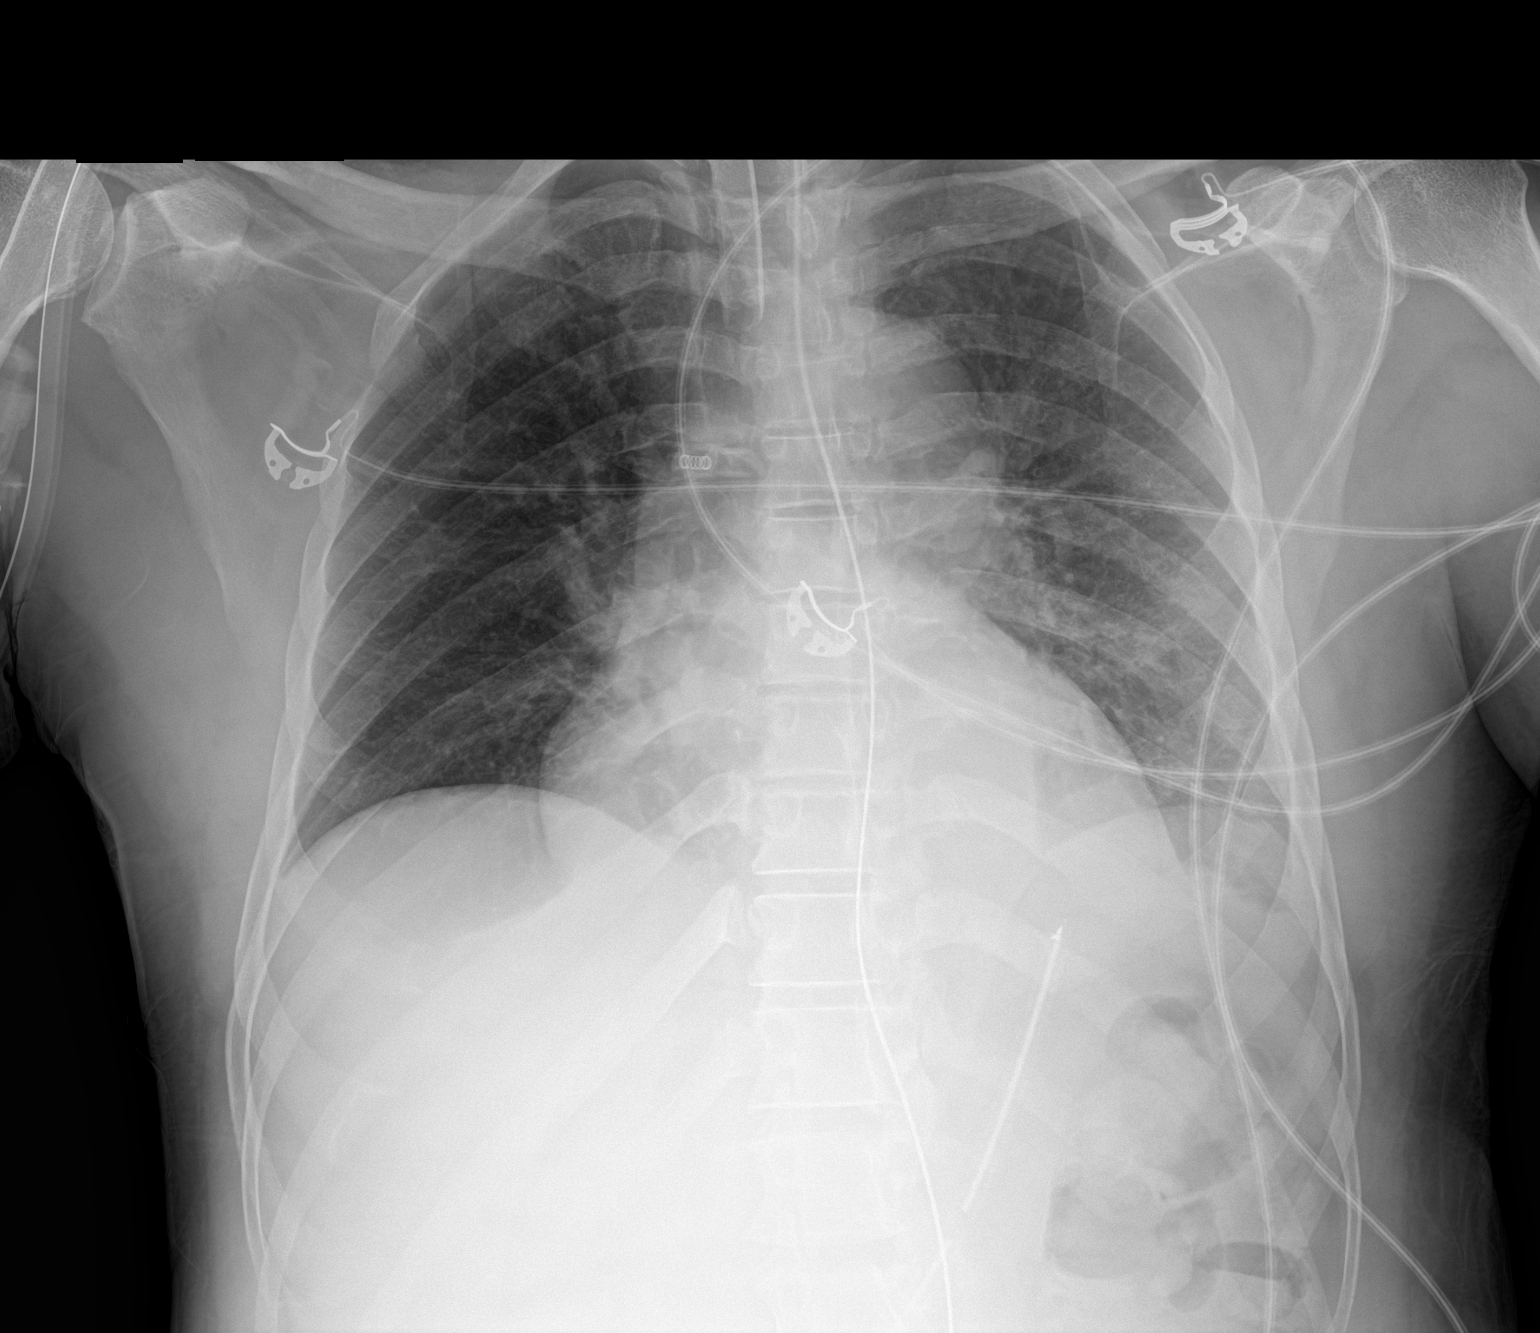

[1 of 1 positions shown; findings below may reference images not displayed]

FINDINGS: The heart size and mediastinal contours are within normal limits. ET
tube is 3 cm above the carina. NG tube is seen coiled in the
proximal stomach. No large airspace consolidation or pleural
effusion. No acute osseous abnormality.
IMPRESSION: ET tube and NG tube in satisfactory position.

No acute cardiopulmonary process.
